# Patient Record
Sex: Female | Born: 1975 | Race: White | Hispanic: No | Marital: Single | Smoking: Current every day smoker
Health system: Southern US, Community
[De-identification: ages and names within clinical notes are randomized; demographics above are authoritative.]

## PROBLEM LIST (undated history)

## (undated) DIAGNOSIS — E119 Type 2 diabetes mellitus without complications: Secondary | ICD-10-CM

## (undated) DIAGNOSIS — I509 Heart failure, unspecified: Secondary | ICD-10-CM

## (undated) DIAGNOSIS — I10 Essential (primary) hypertension: Secondary | ICD-10-CM

---

## 2007-07-09 ENCOUNTER — Emergency Department (HOSPITAL_COMMUNITY): Admission: EM | Admit: 2007-07-09 | Discharge: 2007-07-09 | Payer: Self-pay | Admitting: Emergency Medicine

## 2007-07-12 ENCOUNTER — Emergency Department (HOSPITAL_COMMUNITY): Admission: EM | Admit: 2007-07-12 | Discharge: 2007-07-12 | Payer: Self-pay | Admitting: Emergency Medicine

## 2007-12-09 ENCOUNTER — Inpatient Hospital Stay (HOSPITAL_COMMUNITY): Admission: EM | Admit: 2007-12-09 | Discharge: 2007-12-15 | Payer: Self-pay | Admitting: Family Medicine

## 2009-10-10 IMAGING — CT CT ABDOMEN W/ CM
2 of 5 series · 17 of 46 positions shown, 19 images · IV contrast (APPLIED)
Comparison: None available

CT ABDOMEN

CLINICAL DATA: Left-sided pain, fever, vomiting

CT ABDOMEN AND PELVIS WITH CONTRAST
TECHNIQUE: Multidetector CT imaging of the abdomen and pelvis was
performed using the standard protocol following bolus
administration of intravenous contrast.
Contrast: 100 ml Tmnipaque-LWW IV

[Series 2: abd/pelv with 5.0 b31f st · axial · 0.76mm/px · z∈[-457,-67]mm · 14 of 88 slices shown, 16 images]
[im 5/88  soft-tissue]
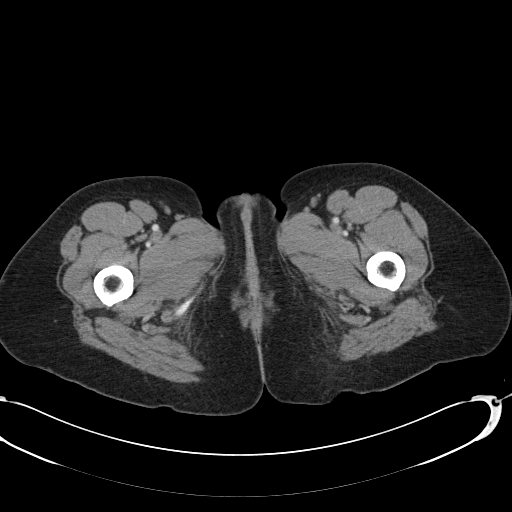
[im 5/88  bone]
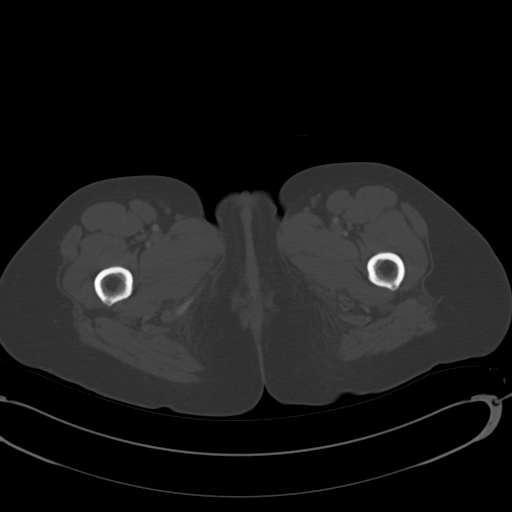
[im 10/88  soft-tissue]
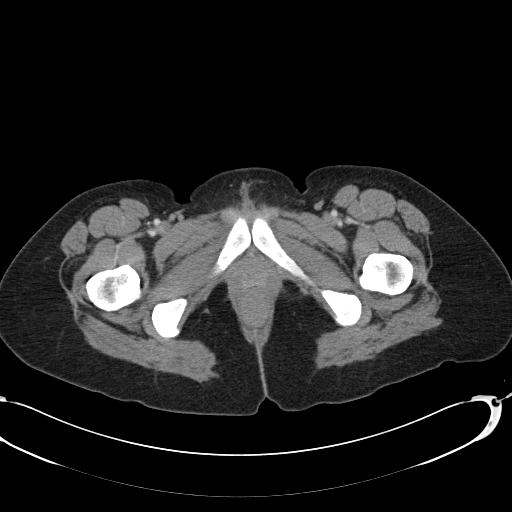
[im 20/88  soft-tissue]
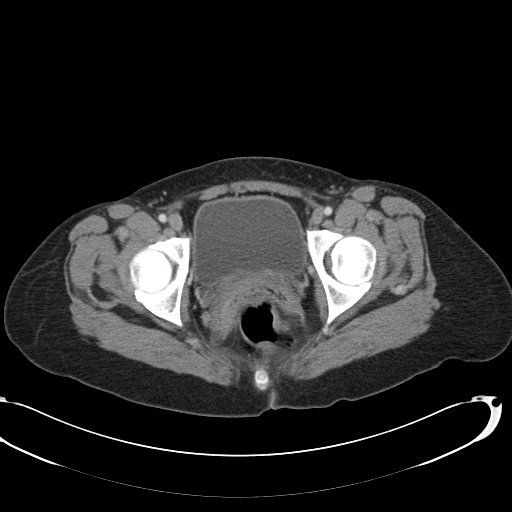
[im 25/88  soft-tissue]
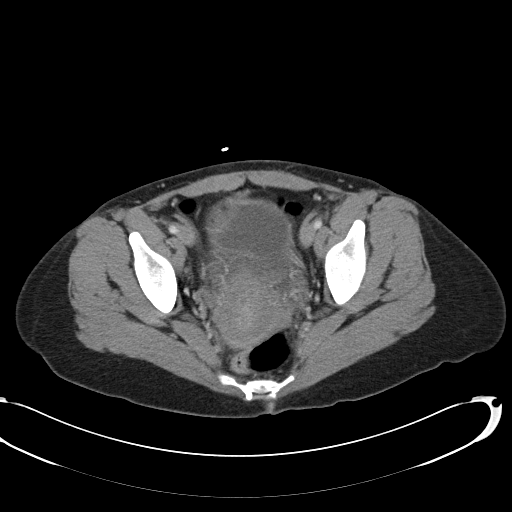
[im 30/88  soft-tissue]
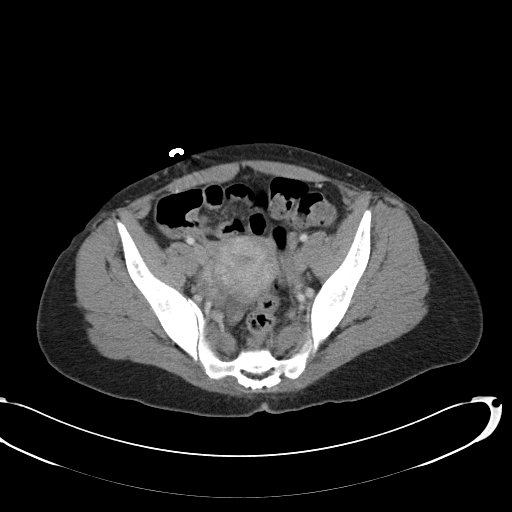
[im 34/88  soft-tissue]
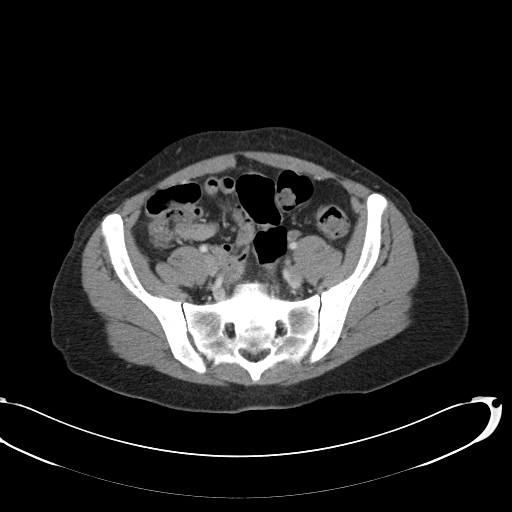
[im 39/88  soft-tissue]
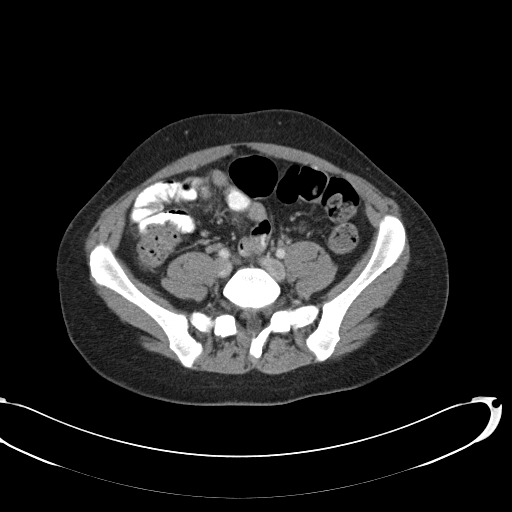
[im 49/88  soft-tissue]
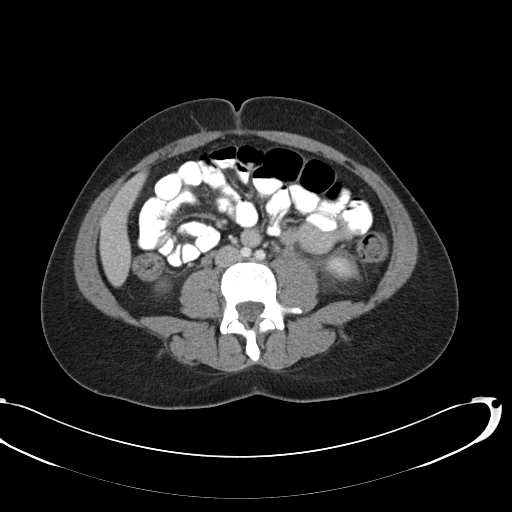
[im 54/88  soft-tissue]
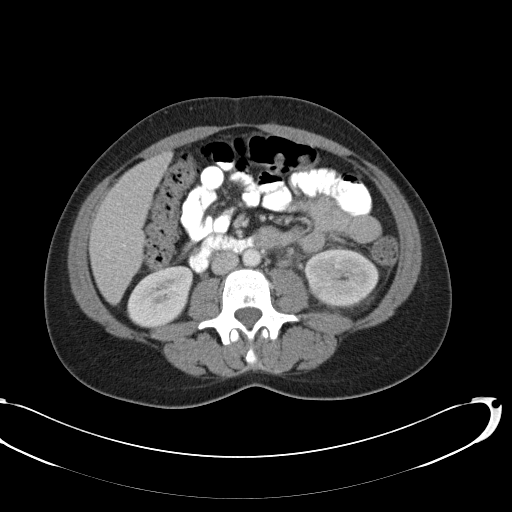
[im 54/88  bone]
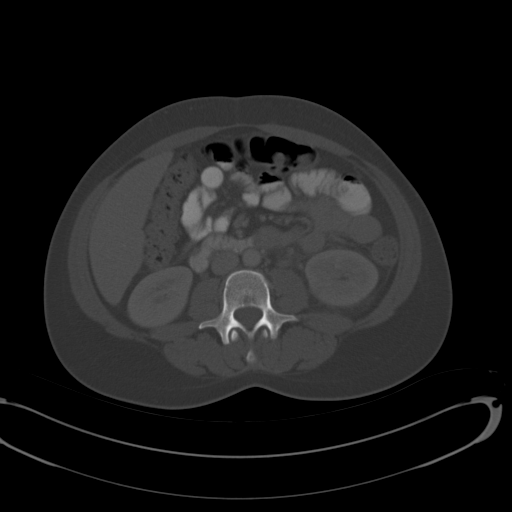
[im 59/88  soft-tissue]
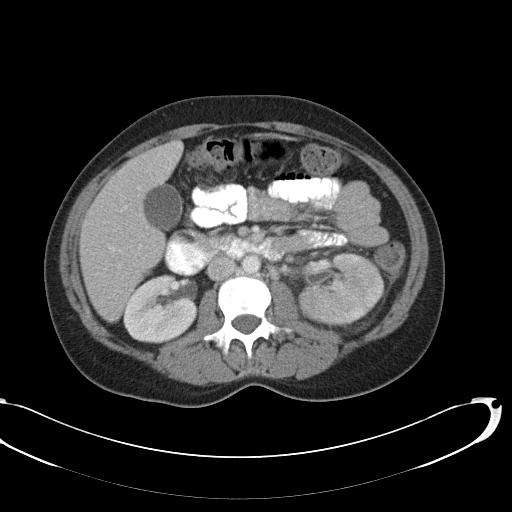
[im 63/88  soft-tissue]
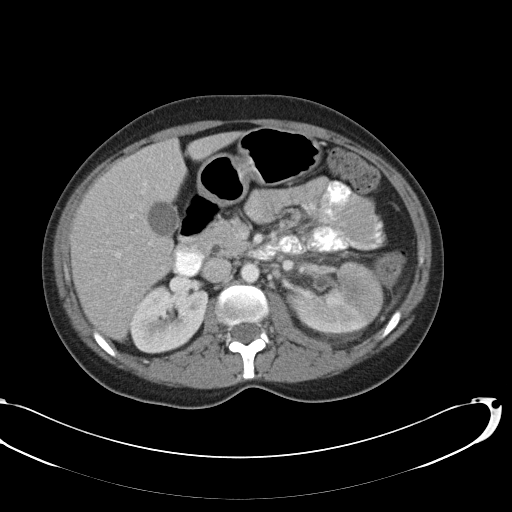
[im 68/88  soft-tissue]
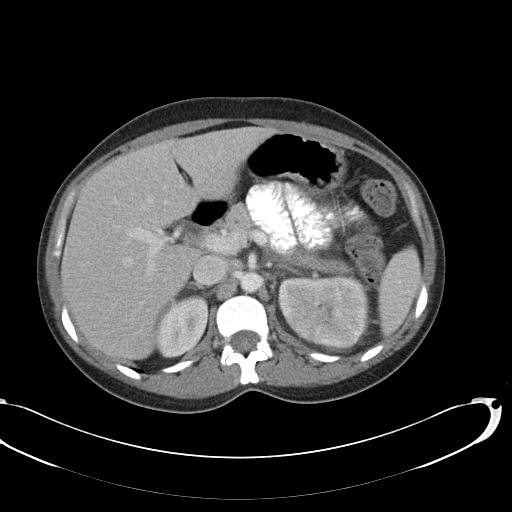
[im 78/88  soft-tissue]
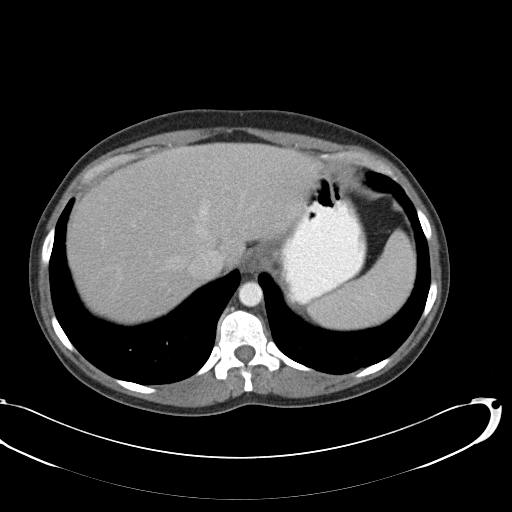
[im 83/88  soft-tissue]
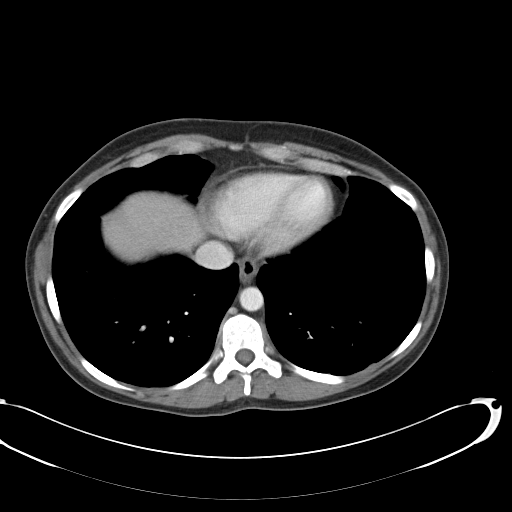

[Series 4: abd/pelv with 2.0 spo cor st · coronal · 0.86mm/px · 3 of 111 slices shown]
[im 37/111  soft-tissue]
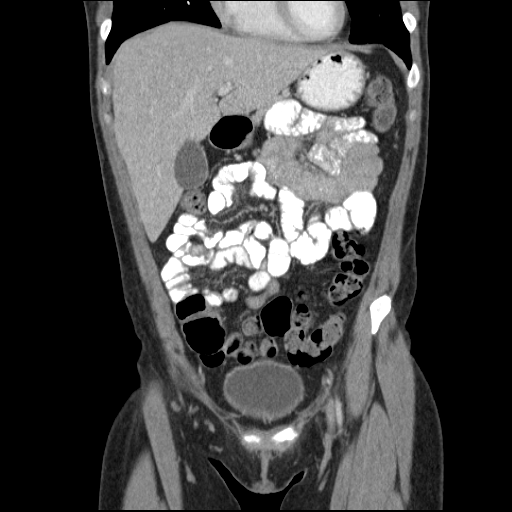
[im 49/111  soft-tissue]
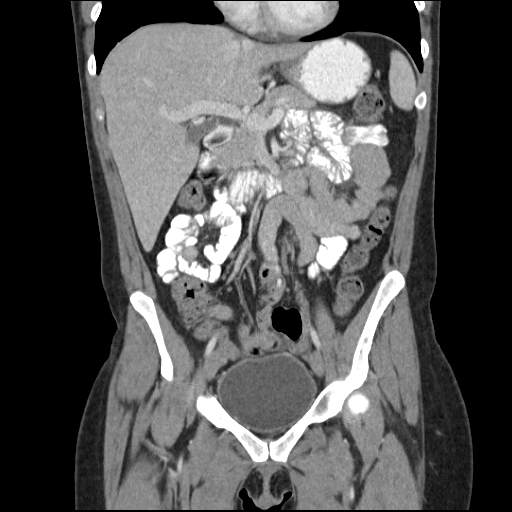
[im 62/111  soft-tissue]
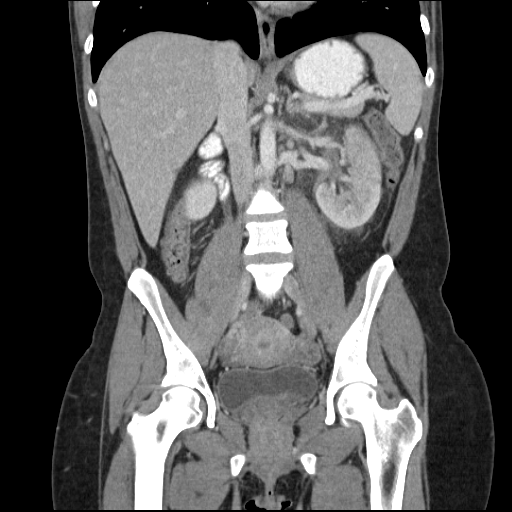

[17 of 46 positions shown; findings below may reference images not displayed]

FINDINGS: Visualized lung bases clear.  Unremarkable liver,
gallbladder, spleen, adrenal glands, right kidney, pancreas.  Left
kidney is somewhat enlarged with decreased   patchy areas of
enhancement on the initial scan with there is are patchy areas of
delayed nephrogram on the delayed images.  There is some urothelial
enhancement centrally in the renal collecting system.  No calculus
or hydronephrosis.  Inflammatory/edematous changes around the left
kidney.  A circumaortic left renal vein is incidentally noted.
Portal vein patent.  Small bowel nondilated.  No free air.  No
ascites.
IMPRESSION: 1.  Probable left pyelonephritis without evidence of calculus or
obstruction.

CT PELVIS
FINDINGS: Appendix not discretely identified.  The colon is
nondilated, unremarkable.  Uterus and adnexal regions unremarkable.
The urinary bladder is physiologically distended.  No free fluid.
Distal ureters decompressed without calculus.
IMPRESSION: 1.  No acute pelvic process

## 2010-10-15 ENCOUNTER — Emergency Department (HOSPITAL_BASED_OUTPATIENT_CLINIC_OR_DEPARTMENT_OTHER)
Admission: EM | Admit: 2010-10-15 | Discharge: 2010-10-15 | Disposition: A | Discharge status: Other Acute Inpatient Hospital | Payer: Self-pay | Attending: Emergency Medicine | Admitting: Emergency Medicine

## 2010-10-15 ENCOUNTER — Inpatient Hospital Stay (HOSPITAL_COMMUNITY)
Admission: RE | Admit: 2010-10-15 | Discharge: 2010-10-18 | DRG: 690 | Disposition: A | Discharge status: Home / Self Care | Payer: Self-pay | Source: Ambulatory Visit | Attending: Internal Medicine | Admitting: Internal Medicine

## 2010-10-15 DIAGNOSIS — A498 Other bacterial infections of unspecified site: Secondary | ICD-10-CM | POA: Diagnosis present

## 2010-10-15 DIAGNOSIS — R1031 Right lower quadrant pain: Secondary | ICD-10-CM | POA: Insufficient documentation

## 2010-10-15 DIAGNOSIS — R1110 Vomiting, unspecified: Secondary | ICD-10-CM | POA: Insufficient documentation

## 2010-10-15 DIAGNOSIS — F1020 Alcohol dependence, uncomplicated: Secondary | ICD-10-CM | POA: Diagnosis present

## 2010-10-15 DIAGNOSIS — N12 Tubulo-interstitial nephritis, not specified as acute or chronic: Principal | ICD-10-CM | POA: Diagnosis present

## 2010-10-15 DIAGNOSIS — R7309 Other abnormal glucose: Secondary | ICD-10-CM | POA: Diagnosis present

## 2010-10-15 DIAGNOSIS — R109 Unspecified abdominal pain: Secondary | ICD-10-CM | POA: Insufficient documentation

## 2010-10-15 DIAGNOSIS — F17200 Nicotine dependence, unspecified, uncomplicated: Secondary | ICD-10-CM | POA: Diagnosis present

## 2010-10-15 DIAGNOSIS — D72829 Elevated white blood cell count, unspecified: Secondary | ICD-10-CM | POA: Diagnosis present

## 2010-10-15 DIAGNOSIS — E860 Dehydration: Secondary | ICD-10-CM | POA: Diagnosis present

## 2010-10-15 LAB — DIFFERENTIAL
Basophils Absolute: 0 10*3/uL (ref 0.0–0.1)
Lymphocytes Relative: 10 % — ABNORMAL LOW (ref 12–46)
Monocytes Absolute: 2.1 10*3/uL — ABNORMAL HIGH (ref 0.1–1.0)
Monocytes Relative: 11 % (ref 3–12)
Neutrophils Relative %: 79 % — ABNORMAL HIGH (ref 43–77)

## 2010-10-15 LAB — PREGNANCY, URINE: Preg Test, Ur: NEGATIVE

## 2010-10-15 LAB — COMPREHENSIVE METABOLIC PANEL
ALT: 15 U/L (ref 0–35)
CO2: 24 mEq/L (ref 19–32)
Chloride: 102 mEq/L (ref 96–112)
Creatinine, Ser: 1 mg/dL (ref 0.4–1.2)
GFR calc Af Amer: >60 mL/min (ref >60)
Glucose, Bld: 87 mg/dL (ref 70–99)
Potassium: 4.2 mEq/L (ref 3.5–5.1)
Sodium: 143 mEq/L (ref 135–145)
Total Protein: 8.4 g/dL — ABNORMAL HIGH (ref 6.0–8.3)

## 2010-10-15 LAB — CBC
MCV: 93.3 fL (ref 78.0–100.0)
Platelets: 329 10*3/uL (ref 150–400)

## 2010-10-15 LAB — URINE MICROSCOPIC-ADD ON

## 2010-10-15 LAB — URINALYSIS, ROUTINE W REFLEX MICROSCOPIC
Bilirubin Urine: NEGATIVE
Nitrite: POSITIVE — AB
Protein, ur: 100 mg/dL — AB
Urobilinogen, UA: 0.2 mg/dL (ref 0.0–1.0)
pH: 7 (ref 5.0–8.0)

## 2010-10-16 LAB — COMPREHENSIVE METABOLIC PANEL
AST: 46 U/L — ABNORMAL HIGH (ref 0–37)
Alkaline Phosphatase: 74 U/L (ref 39–117)
Calcium: 8.1 mg/dL — ABNORMAL LOW (ref 8.4–10.5)
Creatinine, Ser: 0.93 mg/dL (ref 0.4–1.2)
GFR calc Af Amer: >60 mL/min (ref >60)
Glucose, Bld: 199 mg/dL — ABNORMAL HIGH (ref 70–99)
Total Bilirubin: 1 mg/dL (ref 0.3–1.2)

## 2010-10-16 LAB — CBC
MCHC: 32.3 g/dL (ref 30.0–36.0)
Platelets: 251 10*3/uL (ref 150–400)
RDW: 12.6 % (ref 11.5–15.5)

## 2010-10-16 LAB — DIFFERENTIAL
Basophils Absolute: 0 10*3/uL (ref 0.0–0.1)
Eosinophils Relative: 0 % (ref 0–5)
Lymphocytes Relative: 5 % — ABNORMAL LOW (ref 12–46)
Monocytes Absolute: 1.1 10*3/uL — ABNORMAL HIGH (ref 0.1–1.0)
Neutro Abs: 11.3 10*3/uL — ABNORMAL HIGH (ref 1.7–7.7)
Neutrophils Relative %: 86 % — ABNORMAL HIGH (ref 43–77)

## 2010-10-17 LAB — URINE CULTURE
Colony Count: >=100000
Culture  Setup Time: 201202262107

## 2010-10-17 LAB — BASIC METABOLIC PANEL
BUN: 2 mg/dL — ABNORMAL LOW (ref 6–23)
Chloride: 108 mEq/L (ref 96–112)
GFR calc non Af Amer: >60 mL/min (ref >60)

## 2010-10-17 LAB — CBC: MCH: 31.4 pg (ref 26.0–34.0)

## 2010-10-18 NOTE — Discharge Summary (Signed)
NAMEPATRICK, SOHM             ACCOUNT NO.:  [id number]  MEDICAL RECORD NO.:  [id number]           PATIENT TYPE:  I  LOCATION:  1321                         FACILITY:  [facility]  PHYSICIAN:  Hillery Aldo, M.D.   DATE OF BIRTH:  August 23, 1975  DATE OF ADMISSION:  10/15/2010 DATE OF DISCHARGE:  10/18/2010                              DISCHARGE SUMMARY   PRIMARY CARE PHYSICIAN:  Maurice March, M.D. at [facility]  DISCHARGE DIAGNOSES: 1. Escherichia coli pyelonephritis. 2. Alcoholism. 3. Tobacco abuse. 4. Hyperglycemia.  DISCHARGE MEDICATIONS: 1. Tylenol 650 mg p.o. q.4 h. p.r.n. fever. 2. Bactrim DS 1 tablet p.o. b.i.d. x10 days. 3. Hydrocodone 5/325, 1-2 tablets p.o. q.4 h. p.r.n. moderate to     severe pain. 4. Multivitamin 1 capsule p.o. daily.  CONSULTATIONS:  None.  BRIEF ADMISSION HISTORY OF PRESENT ILLNESS:  The patient is a 35 year old female with past medical history of alcohol dependency, who presentsto the hospital with dysuria for 3 days.  She also complained of right- sided flank pain.  She was evaluated in the emergency department and subsequently referred to the Hospitalist Service for inpatient treatment.  For full details, please see the dictated report done by Dr. Shawnie Dapper.  PROCEDURES/DIAGNOSTIC STUDIES:  None.  DISCHARGE LABORATORY VALUES:  Urine cultures grew greater than 100,000 colonies/mL of Escherichia coli, Septra sensitive.  Sodium was 137, potassium 3.9, chloride 108, bicarb 26, BUN 2, creatinine 0.84, glucose 151, calcium 8.3.  White blood cell count was 9.7, hemoglobin 12, hematocrit 38, platelets 217.  Blood cultures were negative to date.Marland Kitchen  HOSPITAL COURSE BY PROBLEM: 1. E coli pyelonephritis, the patient was placed on Rocephin     empirically.  Culture and sensitivity data have returned and the     organism is sensitive to Septra which she will be discharged on     additional 10 days of therapy.  Symptoms are resolving. 2. Alcohol  dependency, the patient was detoxed with Ativan per the     [facility] protocol.  She was supplemented with thiamine and folic acid     while in the hospital. 3. Tobacco abuse, the patient was counseled.  She was provided with     nicotine patch while in the hospital.  Encouraged to continue     nicotine patches to help with cessation post discharge. 4. Hyperglycemia.  The patient does not have a known diagnosis of     diabetes.  At this point, the patient has been counseled to     decrease sugar and concentrated sweets and to follow up with Dr.     Audria Nine for formal testing.  This should be done once the acute     illness of pyelonephritis has resolved.  DISPOSITION:  The patient is medically stable and will be discharged home.  Time spent coordinating care for discharge and discharge instructions including face-to-face time equaled 20 minutes.     Hillery Aldo, M.D.     CR/MEDQ  D:  10/18/2010  T:  10/18/2010  Job:  [id number]  cc:   Maurice March, M.D. Fax: [phone]  Electronically Signed by Hillery Aldo M.D. on 10/18/2010  04:19:17 PM

## 2010-10-21 LAB — CULTURE, BLOOD (ROUTINE X 2)
Culture  Setup Time: 201202262354
Culture  Setup Time: 201202262354
Culture: NO GROWTH
Culture: NO GROWTH

## 2011-01-02 NOTE — H&P (Signed)
Nancy [name], Nancy [name]             ACCOUNT NO.:  [id number]   MEDICAL RECORD NO.:  [id number]          PATIENT TYPE:  INP   LOCATION:  6729                         FACILITY:  [facility]   PHYSICIAN:  Della Goo, M.D. DATE OF BIRTH:  [birth year]   DATE OF ADMISSION:  12/09/2007  DATE OF DISCHARGE:                              HISTORY & PHYSICAL   PRIMARY CARE PHYSICIAN:  Georgianne Fick, M.D.   CHIEF COMPLAINT:  Fever.   HISTORY OF THE PRESENT ILLNESS:  This is a 35 year old female who  presented to the emergency department with complaints of fevers, chills  and shakes for 3 days.  She reports that on Sunday she had a temperature  of 104 and she took ibuprofen.  She reports also having dysuria for 1  week and also having left-sided flank pain.  She reports beginning to  have nausea and vomiting over the past 24 hours, and loose stools times  one.   PAST MEDICAL HISTORY:  The past medical history significant for anxiety.   PAST SURGICAL HISTORY:  History of a C-section times one.   MEDICATIONS:  The patient's medications at this time include ibuprofen  400 mg as needed and Darvocet N 100 as needed.   ALLERGIES:  No known drug allergies.   SOCIAL HISTORY:  The patient is a smoker and smokes one pack per day.  She is a drinker and reports drinking alcohol, several drinks, but  reports that she has not drank for the past 3 days.   FAMILY HISTORY:  The family history is noncontributory.   REVIEW OF SYSTEMS:  On the review of systems the pertinents are as  mentioned above.  The patient denies any shortness of breath, chest  pains, headache, syncope, or dizziness.   PHYSICAL EXAMINATION:  GENERAL APPEARANCE:  Physical examination  findings reveal this is a 35 year old well-nourished and well-developed  female in discomfort, but in no acute distress.  VITAL SIGNS:  Temperature 101.2, blood pressure 118/78, heart rate 88,  respirations 18, and O2 sat 98-99%.  HEENT:   Normocephalic and atraumatic.  Pupils are equal, round and react  to light.  Extraocular muscles are intact.  Funduscopic is benign.  Oropharynx is clear.  NECK:  The neck is supple with full range of motion.  No thyromegaly,  adenopathy or jugular venous distention.  HEART:  Cardiovascular exam shows regular rate and rhythm.  No murmurs,  gallops or rubs.  LUNGS:  The lungs are clear to auscultation bilaterally.  ABDOMEN:  The abdomen has positive bowel sounds, is soft, nontender and  no distended.  EXTREMITIES:  The extremities are without cyanosis, clubbing or edema.  BACK EXAMINATION:  The back examination shows positive left-sided flank  pain.  NEUROLOGIC:  The neurological examination is nonfocal.   LABORATORY STUDIES:  White blood cell count 13.4, hemoglobin 11.7,  hematocrit 33.8, MCV 95.5, and platelets 171,000 with neutrophils 89%.  Chemistry with a sodium of 136, potassium 3.2, chloride 105 carbon  dioxide 25, BUN 10, creatinine 1.03, and glucose 100.  Alcohol level was  less than 5.  Amylase 28 and lipase 14.  Urine drug screen was positive  for opiates.  Urinalysis showed large leukocyte esterase,  positive  nitrites and moderate urine hemoglobin.  Chest x-ray revealed no active  or acute disease.  CT scan of the abdomen and pelvis revealed left  pyelonephritis without calculus or obstruction; and, no other remarkable  findings.   ASSESSMENT:  The patient is a 35 year old female being admitted with:  1. Pyelonephritis.  2. Sepsis.  3. Hypokalemia.  4. Mild anemia  5. Tobacco abuse.   PLAN:  The patient will be admitted and will be placed on IV antibiotic  therapy of ciprofloxacin along with Rocephin therapy.  The patient will  also be placed on IV fluids for fluid rehydration therapy and  maintenance therapy.  Antiemetics have been ordered along with pain  control therapy as needed.  The patient will have repletion of her  potassium and her electrolytes will be  monitored.  A nicotine patch will  also be ordered for this patient.  She has been counseled on smoking and  alcohol usage.  DVT and GI prophylaxes have also been ordered.      Della Goo, M.D.  Electronically Signed     HJ/MEDQ  D:  12/10/2007  T:  12/10/2007  Job:  [id number]   cc:   Georgianne Fick, M.D.

## 2011-01-05 NOTE — Discharge Summary (Signed)
[name], [name]             ACCOUNT NO.:  [id number]   MEDICAL RECORD NO.:  [id number]          PATIENT TYPE:  INP   LOCATION:  6729                         FACILITY:  [facility]   PHYSICIAN:  Lonia Blood, M.D.      DATE OF BIRTH:  [birth year]   DATE OF ADMISSION:  12/09/2007  DATE OF DISCHARGE:  12/15/2007                               DISCHARGE SUMMARY   PRIMARY CARE PHYSICIAN:  [facility]   DISCHARGE DIAGNOSES:  1. Escherichia coli sepsis.  2. Delirium tremens.  3. Tobacco abuse.   DISCHARGE MEDICATIONS:  1. Bactrim Double Strength 1 tablet twice a day for 8 days.  2. Vicodin 5/500 one tablet every 6 hours as needed.  3. Thiamine 100 mg daily.  4. Folate 1 mg daily.   DISPOSITION:  The patient was to follow up at [facility] on completion  of 2 weeks total of antibiotics.  She is to complete with Septra.  Her  E. coli was sensitive to Septra.   PROCEDURES PERFORMED:  None.   CONSULTATIONS:  None.   BRIEF HISTORY AND PHYSICAL:  Please refer to dictated history and  physical on admission by Dr. Della Goo.  Of note, the patient  also follows with Dr. Georgianne Fick.  The patient is a 35 year old  female that came in with fever and chills as well as shakes for about 3  days.  She reported a temperature of 104 at the time of admission.  In  the ER, however, she had a fever of 101.2.  Otherwise, no other  findings.  Her urinalysis was consistent with UTI, and she has some  costovertebral angle tenderness.  She was subsequently admitted with a  diagnosis of acute pyelonephritis.   HOSPITAL COURSE:  1. E. coli sepsis.  The patient was started on IV antibiotics with      ciprofloxacin.  Blood cultures later came back with positive E.      coli consistent with also her urine.  She responded to the      antibiotics for a while.  At the time of discharge, based on the      sensitivity of her E. coli, she was discharged on Septra to      complete her medications at  home.  2. DT.  The patient reported taking alcohol several drinks a day.  She      was a little bit jittery by the second day and      was presumed to be having delirium tremens.  She was subsequently      started on thiamine folate.  I gave her some Ativan, which seemed      to have calmed her down.  At the time of discharge, she was very      much stable.  Otherwise, no major issues with the patient at the      time of discharge.      Lonia Blood, M.D.  Electronically Signed     LG/MEDQ  D:  02/04/2008  T:  02/04/2008  Job:  [id number]

## 2011-05-15 LAB — CBC
HCT: 31.9 — ABNORMAL LOW
HCT: 32.4 — ABNORMAL LOW
Hemoglobin: 11.6 — ABNORMAL LOW
MCHC: 33.8
MCHC: 34.8
MCHC: 35.8
MCV: 96
MCV: 96.3
Platelets: 220
RBC: 3.38 — ABNORMAL LOW
RBC: 3.54 — ABNORMAL LOW
RDW: 12.8
RDW: 13.6
WBC: 11.2 — ABNORMAL HIGH
WBC: 15.7 — ABNORMAL HIGH

## 2011-05-15 LAB — POCT URINALYSIS DIP (DEVICE)
Glucose, UA: NEGATIVE
Nitrite: POSITIVE — AB
Operator id: 208841
Protein, ur: >=300 — AB
Specific Gravity, Urine: 1.025

## 2011-05-15 LAB — URINALYSIS, ROUTINE W REFLEX MICROSCOPIC
Glucose, UA: NEGATIVE
Hgb urine dipstick: NEGATIVE
Ketones, ur: NEGATIVE
Protein, ur: NEGATIVE
Urobilinogen, UA: 4 — ABNORMAL HIGH

## 2011-05-15 LAB — URINE MICROSCOPIC-ADD ON

## 2011-05-15 LAB — COMPREHENSIVE METABOLIC PANEL
Albumin: 2.4 — ABNORMAL LOW
BUN: 10
Calcium: 7.7 — ABNORMAL LOW
Creatinine, Ser: 1.03
Glucose, Bld: 100 — ABNORMAL HIGH
Potassium: 3.2 — ABNORMAL LOW
Total Protein: 5.6 — ABNORMAL LOW

## 2011-05-15 LAB — BASIC METABOLIC PANEL
BUN: 2 — ABNORMAL LOW
CO2: 23
CO2: 24
CO2: 28
Calcium: 8.7
Chloride: 109
Chloride: 115 — ABNORMAL HIGH
Creatinine, Ser: 0.86
GFR calc Af Amer: >60
GFR calc Af Amer: >60
Glucose, Bld: 124 — ABNORMAL HIGH
Glucose, Bld: 94
Potassium: 4
Potassium: 4

## 2011-05-15 LAB — URINE CULTURE

## 2011-05-15 LAB — CULTURE, BLOOD (ROUTINE X 2)

## 2011-05-15 LAB — DIFFERENTIAL
Basophils Relative: 0
Basophils Relative: 0
Eosinophils Absolute: 0.1
Eosinophils Absolute: 0.1
Eosinophils Relative: 1
Eosinophils Relative: 1
Lymphocytes Relative: 3 — ABNORMAL LOW
Lymphs Abs: 0.4 — ABNORMAL LOW
Lymphs Abs: 1.2
Lymphs Abs: 1.7
Monocytes Absolute: 1
Monocytes Absolute: 1.8 — ABNORMAL HIGH
Monocytes Relative: 12
Monocytes Relative: 8
Neutro Abs: 11.9 — ABNORMAL HIGH
Neutrophils Relative %: 89 — ABNORMAL HIGH

## 2011-05-15 LAB — RAPID URINE DRUG SCREEN, HOSP PERFORMED
Amphetamines: NOT DETECTED
Barbiturates: NOT DETECTED
Benzodiazepines: NOT DETECTED

## 2011-05-15 LAB — ETHANOL: Alcohol, Ethyl (B): <5

## 2011-05-15 LAB — PHOSPHORUS: Phosphorus: 2.1 — ABNORMAL LOW

## 2011-05-29 LAB — POCT PREGNANCY, URINE
Operator id: 285491
Preg Test, Ur: NEGATIVE

## 2011-12-23 ENCOUNTER — Emergency Department (HOSPITAL_BASED_OUTPATIENT_CLINIC_OR_DEPARTMENT_OTHER)
Admission: EM | Admit: 2011-12-23 | Discharge: 2011-12-23 | Disposition: A | Discharge status: Home / Self Care | Payer: No Typology Code available for payment source | Attending: Emergency Medicine | Admitting: Emergency Medicine

## 2011-12-23 ENCOUNTER — Encounter (HOSPITAL_BASED_OUTPATIENT_CLINIC_OR_DEPARTMENT_OTHER): Payer: Self-pay

## 2011-12-23 DIAGNOSIS — R51 Headache: Secondary | ICD-10-CM | POA: Insufficient documentation

## 2011-12-23 DIAGNOSIS — S161XXA Strain of muscle, fascia and tendon at neck level, initial encounter: Secondary | ICD-10-CM

## 2011-12-23 DIAGNOSIS — M545 Low back pain, unspecified: Secondary | ICD-10-CM | POA: Insufficient documentation

## 2011-12-23 DIAGNOSIS — M25569 Pain in unspecified knee: Secondary | ICD-10-CM | POA: Insufficient documentation

## 2011-12-23 DIAGNOSIS — S335XXA Sprain of ligaments of lumbar spine, initial encounter: Secondary | ICD-10-CM | POA: Insufficient documentation

## 2011-12-23 DIAGNOSIS — F17200 Nicotine dependence, unspecified, uncomplicated: Secondary | ICD-10-CM | POA: Insufficient documentation

## 2011-12-23 DIAGNOSIS — S139XXA Sprain of joints and ligaments of unspecified parts of neck, initial encounter: Secondary | ICD-10-CM | POA: Insufficient documentation

## 2011-12-23 DIAGNOSIS — S8000XA Contusion of unspecified knee, initial encounter: Secondary | ICD-10-CM | POA: Insufficient documentation

## 2011-12-23 DIAGNOSIS — M542 Cervicalgia: Secondary | ICD-10-CM | POA: Insufficient documentation

## 2011-12-23 DIAGNOSIS — S8002XA Contusion of left knee, initial encounter: Secondary | ICD-10-CM

## 2011-12-23 DIAGNOSIS — S39012A Strain of muscle, fascia and tendon of lower back, initial encounter: Secondary | ICD-10-CM

## 2011-12-23 DIAGNOSIS — Y92410 Unspecified street and highway as the place of occurrence of the external cause: Secondary | ICD-10-CM | POA: Insufficient documentation

## 2011-12-23 MED ORDER — DIAZEPAM 5 MG PO TABS: 5.0000 mg | ORAL_TABLET | Freq: Two times a day (BID) | ORAL | Status: AC

## 2011-12-23 MED ORDER — HYDROCODONE-ACETAMINOPHEN 5-325 MG PO TABS: 1.0000 | ORAL_TABLET | ORAL | Status: AC | PRN

## 2011-12-23 NOTE — Discharge Instructions (Signed)
Take vicodin as needed for severe pain.  Take valium for aches/spasms in neck and low back.  Do not drive within four hours of taking either of these medications.  Continue your advil three times a day for the next 3-4 days.  Apply heat or ice to painful muscles and ice to left knee 2-3 times a day for 15-20 minutes.  Elevate left leg whenever possible.  Avoid activities that aggravate pain.   You may return to the ER if symptoms worsen or you have any other concerns.
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
## 2011-12-24 NOTE — ED Provider Notes (Signed)
Medical screening examination/treatment/procedure(s) were performed by non-physician practitioner and as supervising physician I was immediately available for consultation/collaboration.   Gwyneth Sprout, MD 12/24/11 2144

## 2013-12-27 ENCOUNTER — Emergency Department (HOSPITAL_BASED_OUTPATIENT_CLINIC_OR_DEPARTMENT_OTHER): Payer: Self-pay

## 2013-12-27 ENCOUNTER — Encounter (HOSPITAL_BASED_OUTPATIENT_CLINIC_OR_DEPARTMENT_OTHER): Payer: Self-pay | Admitting: Emergency Medicine

## 2013-12-27 ENCOUNTER — Emergency Department (HOSPITAL_BASED_OUTPATIENT_CLINIC_OR_DEPARTMENT_OTHER)
Admission: EM | Admit: 2013-12-27 | Discharge: 2013-12-27 | Disposition: A | Discharge status: Home / Self Care | Payer: Self-pay | Attending: Emergency Medicine | Admitting: Emergency Medicine

## 2013-12-27 DIAGNOSIS — F1010 Alcohol abuse, uncomplicated: Secondary | ICD-10-CM | POA: Insufficient documentation

## 2013-12-27 DIAGNOSIS — S5010XA Contusion of unspecified forearm, initial encounter: Secondary | ICD-10-CM | POA: Insufficient documentation

## 2013-12-27 DIAGNOSIS — S60221A Contusion of right hand, initial encounter: Secondary | ICD-10-CM

## 2013-12-27 DIAGNOSIS — W2209XA Striking against other stationary object, initial encounter: Secondary | ICD-10-CM | POA: Insufficient documentation

## 2013-12-27 DIAGNOSIS — Y929 Unspecified place or not applicable: Secondary | ICD-10-CM | POA: Insufficient documentation

## 2013-12-27 DIAGNOSIS — Y9389 Activity, other specified: Secondary | ICD-10-CM | POA: Insufficient documentation

## 2013-12-27 DIAGNOSIS — S60229A Contusion of unspecified hand, initial encounter: Secondary | ICD-10-CM | POA: Insufficient documentation

## 2013-12-27 DIAGNOSIS — F17200 Nicotine dependence, unspecified, uncomplicated: Secondary | ICD-10-CM | POA: Insufficient documentation

## 2013-12-27 DIAGNOSIS — S5011XA Contusion of right forearm, initial encounter: Secondary | ICD-10-CM

## 2013-12-27 DIAGNOSIS — F10929 Alcohol use, unspecified with intoxication, unspecified: Secondary | ICD-10-CM
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
## 2014-06-25 ENCOUNTER — Emergency Department (HOSPITAL_COMMUNITY)
Admission: EM | Admit: 2014-06-25 | Discharge: 2014-06-25 | Disposition: A | Discharge status: Home / Self Care | Payer: No Typology Code available for payment source | Attending: Emergency Medicine | Admitting: Emergency Medicine

## 2014-06-25 ENCOUNTER — Encounter (HOSPITAL_COMMUNITY): Payer: Self-pay | Admitting: *Deleted

## 2014-06-25 DIAGNOSIS — F1110 Opioid abuse, uncomplicated: Secondary | ICD-10-CM | POA: Insufficient documentation

## 2014-06-25 DIAGNOSIS — F1410 Cocaine abuse, uncomplicated: Secondary | ICD-10-CM | POA: Insufficient documentation

## 2014-06-25 DIAGNOSIS — F329 Major depressive disorder, single episode, unspecified: Secondary | ICD-10-CM | POA: Insufficient documentation

## 2014-06-25 DIAGNOSIS — F1910 Other psychoactive substance abuse, uncomplicated: Secondary | ICD-10-CM

## 2014-06-25 DIAGNOSIS — F419 Anxiety disorder, unspecified: Secondary | ICD-10-CM | POA: Insufficient documentation

## 2014-06-25 DIAGNOSIS — Z720 Tobacco use: Secondary | ICD-10-CM | POA: Insufficient documentation

## 2014-06-25 LAB — BASIC METABOLIC PANEL
Anion gap: 11 (ref 5–15)
BUN: 8 mg/dL (ref 6–23)
CO2: 25 meq/L (ref 19–32)
CREATININE: 0.75 mg/dL (ref 0.50–1.10)
Calcium: 8.9 mg/dL (ref 8.4–10.5)
Chloride: 100 mEq/L (ref 96–112)
GFR calc Af Amer: >90 mL/min (ref >90)
GFR calc non Af Amer: >90 mL/min (ref >90)
Glucose, Bld: 107 mg/dL — ABNORMAL HIGH (ref 70–99)
Potassium: 3.6 mEq/L — ABNORMAL LOW (ref 3.7–5.3)
SODIUM: 136 meq/L — AB (ref 137–147)

## 2014-06-25 LAB — CBC WITH DIFFERENTIAL/PLATELET
Basophils Absolute: 0 10*3/uL (ref 0.0–0.1)
Basophils Relative: 1 % (ref 0–1)
EOS PCT: 1 % (ref 0–5)
Eosinophils Absolute: 0.1 10*3/uL (ref 0.0–0.7)
HEMATOCRIT: 38.4 % (ref 36.0–46.0)
Hemoglobin: 12.9 g/dL (ref 12.0–15.0)
LYMPHS ABS: 2 10*3/uL (ref 0.7–4.0)
LYMPHS PCT: 33 % (ref 12–46)
MCH: 31.5 pg (ref 26.0–34.0)
MCHC: 33.6 g/dL (ref 30.0–36.0)
MCV: 93.9 fL (ref 78.0–100.0)
MONO ABS: 0.5 10*3/uL (ref 0.1–1.0)
MONOS PCT: 9 % (ref 3–12)
Neutro Abs: 3.4 10*3/uL (ref 1.7–7.7)
Neutrophils Relative %: 56 % (ref 43–77)
Platelets: 358 10*3/uL (ref 150–400)
RBC: 4.09 MIL/uL (ref 3.87–5.11)
RDW: 13.1 % (ref 11.5–15.5)
WBC: 6 10*3/uL (ref 4.0–10.5)

## 2014-06-25 LAB — ACETAMINOPHEN LEVEL: Acetaminophen (Tylenol), Serum: <15 ug/mL (ref 10–30)

## 2014-06-25 LAB — RAPID URINE DRUG SCREEN, HOSP PERFORMED
Amphetamines: NOT DETECTED
Barbiturates: NOT DETECTED
Benzodiazepines: NOT DETECTED
COCAINE: POSITIVE — AB
Opiates: NOT DETECTED
Tetrahydrocannabinol: NOT DETECTED

## 2014-06-25 LAB — SALICYLATE LEVEL

## 2014-06-25 LAB — ETHANOL: Alcohol, Ethyl (B): 240 mg/dL — ABNORMAL HIGH (ref 0–11)

## 2014-06-25 MED ORDER — IBUPROFEN 200 MG PO TABS
600.0000 mg | ORAL_TABLET | Freq: Three times a day (TID) | ORAL | Status: DC | PRN
Start: 2014-06-25 — End: 2014-06-25

## 2014-06-25 MED ORDER — LORAZEPAM 1 MG PO TABS
1.0000 mg | ORAL_TABLET | Freq: Three times a day (TID) | ORAL | Status: DC | PRN
Start: 2014-06-25 — End: 2014-06-25

## 2014-06-25 MED ORDER — NICOTINE 21 MG/24HR TD PT24: 21.0000 mg | MEDICATED_PATCH | Freq: Every day | TRANSDERMAL | Status: DC

## 2014-06-25 MED ORDER — CLONIDINE HCL 0.1 MG PO TABS: 0.1000 mg | ORAL_TABLET | Freq: Four times a day (QID) | ORAL | Status: DC | PRN

## 2014-06-25 MED ORDER — PROMETHAZINE HCL 25 MG PO TABS: 25.0000 mg | ORAL_TABLET | Freq: Four times a day (QID) | ORAL | Status: DC | PRN

## 2014-06-25 MED ORDER — ONDANSETRON HCL 4 MG PO TABS
4.0000 mg | ORAL_TABLET | Freq: Three times a day (TID) | ORAL | Status: DC | PRN
Start: 2014-06-25 — End: 2014-06-25

## 2014-06-25 NOTE — ED Notes (Signed)
Patient requesting to leave. Levander CampionSheri Upstill, PA-C made aware of same. Patient to sign out AMA when driver arrives to room.
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
## 2014-06-25 NOTE — Discharge Instructions (Signed)
°Emergency Department Resource Guide °1) Find a Doctor and Pay Out of Pocket °Although you won't have to find out who is covered by your insurance plan, it is a good idea to ask around and get recommendations. You will then need to call the office and see if the doctor you have chosen will accept you as a new patient and what types of options they offer for patients who are self-pay. Some doctors offer discounts or will set up payment plans for their patients who do not have insurance, but you will need to ask so you aren't surprised when you get to your appointment. ° °2) Contact Your Local Health Department °Not all health departments have doctors that can see patients for sick visits, but many do, so it is worth a call to see if yours does. If you don't know where your local health department is, you can check in your phone book. The CDC also has a tool to help you locate your state's health department, and many state websites also have listings of all of their local health departments. ° °3) Find a Walk-in Clinic °If your illness is not likely to be very severe or complicated, you may want to try a walk in clinic. These are popping up all over the country in pharmacies, drugstores, and shopping centers. They're usually staffed by nurse practitioners or physician assistants that have been trained to treat common illnesses and complaints. They're usually fairly quick and inexpensive. However, if you have serious medical issues or chronic medical problems, these are probably not your best option. ° °No Primary Care Doctor: °- Call [facility] at  832-8000 - they can help you locate a primary care doctor that  accepts your insurance, provides certain services, etc. °- Physician Referral Service- 1-800-533-3463 ° °Chronic Pain Problems: °Organization         Address  Phone   Notes  °[facility]  (336) 297-2271 Patients need to be referred by their primary care doctor.  ° °Medication  Assistance: °Organization         Address  Phone   Notes  °[place] [facility] [address] °[place], [place] [address] (336) 641-8030 --Must be a resident of [place] °-- Must have NO insurance coverage whatsoever (no Medicaid/ Medicare, etc.) °-- The pt. MUST have a primary care doctor that directs their care regularly and follows them in the community °  °[facility]  (866) 331-1348   °[employer]  (888) 892-1162   ° °Agencies that provide inexpensive medical care: °Organization         Address  Phone   Notes  °[facility]  (336) 832-8035   °[facility]    (336) 832-7272   °[facility] [address] °[place], [place] [address] (336) 832-4777   °[facility] [address], °[place] (336) 271-4999   °[facility]    (336) 373-0678   °[facility]    (336) 272-1050   °[facility] ° [address], [place] Phone:  (336) 832-4444, Fax:  (336) 832-4440 Hours of Operation:  9 am - 6 pm, M-F.  Also accepts Medicaid/Medicare and self-pay.  °[facility] ° [address], [place] Phone: (336) 832-3150, Fax: (336) 832-3151. Hours of Operation:  8:30 am - 5:30 pm, M-F.  Also accepts Medicaid and self-pay.  °[facility] [place] [address], [place] Phone: (336) 878-6027   °[facility] [address], [place], [place] (336)723-1848, Ext. 123 Mondays & Thursdays: 7-9 AM.  First 15 patients are seen on a first come, first serve basis. °  ° °Medicaid-accepting [place] Providers: ° °Organization         Address  Phone   Notes  °[facility] [address], [place] (336) 641-2100 Also accepts self-pay patients.  °[facility] [address], [place] ° (336) 856-9996   °[facility] [address], [place]  (336) 288-8857   °[facility] [address] [place] [address], [place] (336) 299-7000   °Veita Bland [address], [place]  ° (336) 373-1557 Only accepts [place] Access Medicaid patients after they have their name applied to their card.  ° °Self-Pay (no insurance) in [place]: ° °Organization         Address  Phone   Notes  °Sickle Cell Patients, [facility] [address], [place] (336) 832-1970   °[facility] [address], [place] (336) 832-4400   °[facility] ° [address],  (336) 992-4800   °[facility]/Dr. Osei-Bonsu ° [address] [place] [address], [place] or [address], [place] (336) 841-8500 Phone number for both [place] and [place] locations is the same.  °[facility] [address], [place] (336) 299-0000   °[facility] [address] [place] [address], [place] or [address] (336) 852-7530 °(336) 878-2260   °[facility] [address], [place] (336) 350-1642, phone; (336) 294-5005, fax Sees patients 1st and 3rd Saturday of every month.  Must not qualify for public or private insurance (i.e. Medicaid, Medicare, [place] Health Choice, Veterans' Benefits) • Household income should be no more than 200% of the poverty level •The clinic cannot treat you if you are pregnant or think you are pregnant • Sexually transmitted diseases are not treated at the clinic.  ° ° °Dental Care: °Organization         Address  Phone  Notes  °[place] [facility] [address],  (336) 641-6152 Accepts children up to age 21 who are enrolled in Medicaid or [place] Health Choice; pregnant women with a Medicaid card; and children who have applied for Medicaid or [place] Health Choice, but were declined, whose parents can pay a reduced fee at time of service.  °[place]  [facility] [place]  [address], [place] (336) 641-7733 Accepts children up to age 21 who are enrolled in Medicaid or [place] Health Choice; pregnant women with a Medicaid card; and children who have applied for Medicaid or [place] Health Choice, but were declined, whose parents can pay a reduced fee at time of service.  °[facility] ° [address],  (336) 641-4533 Patients are seen by appointment only. Walk-ins are not accepted. [facility] will see patients 18 years of age and older. °Monday - Tuesday (8am-5pm) °Most Wednesdays (8:30-5pm) °$30 per visit, cash only  °[facility] ° [address], [place] (336) 641-4533 Patients are seen by appointment only. Walk-ins are not accepted. [facility] will see patients 18 years of age and older. °One   Wednesday Evening (Monthly: Volunteer Based).  $30 per visit, cash only  °[facility]  (919) 537-3737 for adults; Children under age 4, call Graduate Pediatric Dentistry at ([phone]) [phone]. Children aged 4-14, please call (919) [phone] to request a pediatric application. ° Dental services are provided in all areas of dental care including fillings, crowns and bridges, complete and partial dentures, implants, gum treatment, root canals, and extractions. Preventive care is also provided. Treatment is provided to both adults and children. °Patients are selected via a lottery and there is often a waiting list. °  °[facility] [address], °[place] ° (336) 763-8833 [email or web address] °  °[facility] [address], [place], [place] (336)723-1848, Ext. 123 Second and Fourth Thursday of each month, opens at 6:30 AM; Clinic ends at 9 AM.  Patients are seen on a first-come first-served basis, and a limited number are seen during each clinic.  ° °[facility] ° [address], [place], [place] (336) 723-7904    Eligibility Requirements °You must have lived in [place], [place], or [place] counties for at least the last three months. °  You cannot be eligible for state or federal sponsored healthcare insurance, including Veterans Administration, Medicaid, or Medicare. °  You generally cannot be eligible for healthcare insurance through your employer.  °  How to apply: °Eligibility screenings are held every Tuesday and Wednesday afternoon from 1:00 pm until 4:00 pm. You do not need an appointment for the interview!  °[facility] [address], [place], [place] 336-631-2330   °[facility]  336-342-8273   °[facility]  336-703-3100   °[facility]  336-570-6415   ° °Behavioral Health Resources in the Community: °Intensive Outpatient Programs °Organization         Address  Phone  Notes  °[facility] [address], [place], [place] 336-878-6098   °[facility] [address], [place], [place] 336-832-9800   °[facility] [address], [place], [place] ° 336-882-2125   °[facility] [address],  °[place], [place] 1-800-853-5163 or 336-641-4981   °Substance Abuse Resources °Organization         Address  Phone  Notes  °[facility]  336-882-2125   °[facility]  336-784-9470   °[facility]  336-285-9073   °[facility]  336-845-3988   °Residential & Outpatient Substance Abuse Program  1-800-659-3381   °Psychological Services °Organization         Address  Phone  Notes  °[facility]  336- 832-9600   °[facility]  336- 378-7881   °[facility] [address], [place] 1-800-853-5163 or 336-641-4981   ° °Mobile Crisis Teams °Organization         Address  Phone  Notes  °[facility], Mobile Crisis Care Unit  1-877-626-1772   °Assertive °[facility] ° [address]  [place], [place] 336-834-9664   °Sharon DeEsch [address] ° [place]  336-554-5454   ° °Self-Help/Support Groups °Organization         Address  Phone             Notes  °[facility] - variety of support groups  336- 373-1402 Call for more information  °Narcotics Anonymous (NA), [facility] [address], °[place]   2 meetings at this location  ° °  Residential Treatment Programs °Organization         Address  Phone  Notes  °[facility] [address],    °[place]  1-866-801-8205   °[facility] ° [address], [place], [place] 704-293-8524   °[facility] [address], [place] 336-845-3988 Admissions: 8am-3pm M-F  °[facility] [address]    °[place], [place] 336-841-1104   °[facility] [address], [place], [place] 336-379-7146   °[facility] [address]  °[place], [place] 336-285-9073   °[facility] - Intensive Outpatient [address], [place], [place] 336-852-3033   °[facility] [address]  °[place], [place] 1-877-615-2722 or 336-784-9470   °[facility] [address] [place], [place] 336-227-7417 Accepts Medicaid  °[facility] [address]  °[place] 1-800-659-3381 Substance Abuse/Addiction Treatment  ° °[place] Behavioral Health Resources °Organization         Address  Phone  Notes  °[facility]  (888) 581-9988   °Julie Brannon, PhD [address] [place], [place]   (336) 349-5553 or (336) 951-0000   °[facility]   [address] °[place], [place] (336) 349-4454   °[facility] [address], [place], [place] (336) 342-8316 Insurance/Medicaid/sponsorship through [facility]  °[facility] [address]                                    [place], [place] (336) 342-8316 Therapy/tele-psych/case    °[facility] [address]  ° [place], [place] (336) 349-2233    °Dr. Arfeen  (336) 349-4544   °[facility] [place]  [employer] [place] [facility] 1) [address], [place] °2) [address], [place] °3)  [address], [place] (336) 349-3220 °(336) 342-7768 ° °(336) 342-8140   °[place] [facility] (336) 342-1394 or (336) 342-3537 (After Hours)    ° ° °
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
## 2015-06-22 ENCOUNTER — Emergency Department (HOSPITAL_BASED_OUTPATIENT_CLINIC_OR_DEPARTMENT_OTHER)
Admission: EM | Admit: 2015-06-22 | Discharge: 2015-06-22 | Disposition: A | Discharge status: Home / Self Care | Payer: Self-pay | Attending: Physician Assistant | Admitting: Physician Assistant

## 2015-06-22 ENCOUNTER — Encounter (HOSPITAL_BASED_OUTPATIENT_CLINIC_OR_DEPARTMENT_OTHER): Payer: Self-pay | Admitting: Emergency Medicine

## 2015-06-22 DIAGNOSIS — L03213 Periorbital cellulitis: Secondary | ICD-10-CM

## 2015-06-22 DIAGNOSIS — H00034 Abscess of left upper eyelid: Secondary | ICD-10-CM | POA: Insufficient documentation

## 2015-06-22 DIAGNOSIS — F17210 Nicotine dependence, cigarettes, uncomplicated: Secondary | ICD-10-CM | POA: Insufficient documentation

## 2015-06-22 MED ORDER — TETRACAINE HCL 0.5 % OP SOLN
2.0000 [drp] | Freq: Once | OPHTHALMIC | Status: AC
  Administered 2015-06-22: 2 [drp] via OPHTHALMIC

## 2015-06-22 MED ORDER — OXYCODONE-ACETAMINOPHEN 5-325 MG PO TABS
1.0000 | ORAL_TABLET | Freq: Once | ORAL | Status: AC
  Administered 2015-06-22: 1 via ORAL
  Filled 2015-06-22: qty 1

## 2015-06-22 MED ORDER — CLINDAMYCIN HCL 300 MG PO CAPS: 300.0000 mg | ORAL_CAPSULE | Freq: Three times a day (TID) | ORAL | Status: DC

## 2015-06-22 MED ORDER — CLINDAMYCIN HCL 150 MG PO CAPS
300.0000 mg | ORAL_CAPSULE | Freq: Once | ORAL | Status: AC
  Administered 2015-06-22: 300 mg via ORAL
  Filled 2015-06-22: qty 2

## 2015-06-22 MED ORDER — TETRACAINE HCL 0.5 % OP SOLN
OPHTHALMIC | Status: AC
  Administered 2015-06-22: 2 [drp] via OPHTHALMIC
  Filled 2015-06-22: qty 2

## 2015-06-22 MED ORDER — FLUORESCEIN SODIUM 1 MG OP STRP
ORAL_STRIP | OPHTHALMIC | Status: AC
  Administered 2015-06-22: 1
  Filled 2015-06-22: qty 1

## 2015-06-22 NOTE — ED Notes (Signed)
Pt reports that she was at work yesterday and felt like her was developing a stye in it. She went home and put medication she had from previous stye on the left eye and awoke with increased swelling and drainage from left eye

## 2015-06-22 NOTE — ED Provider Notes (Signed)
CSN: [id number]     Arrival date & time 06/22/15  1026 History   First MD Initiated Contact with Patient 06/22/15 1107     Chief Complaint  Patient presents with  . Eye Problem     (Consider location/radiation/quality/duration/timing/severity/associated sxs/prior Treatment) HPI   Pt here with eye swelling starting last night. Warm compress help mildly. URI 3 weeks ago. Popped a "stye" on her eyelid earlier this week. No fevers. No systemic symtposm.   History reviewed. No pertinent past medical history. Past Surgical History  Procedure Laterality Date  . Cesarean section     History reviewed. No pertinent family history. Social History  Substance Use Topics  . Smoking status: Current Every Day Smoker -- 1.00 packs/day for 20 years    Types: Cigarettes  . Smokeless tobacco: Never Used  . Alcohol Use: Yes     Comment: every other day   OB History    No data available     Review of Systems  Constitutional: Negative for activity change.  HENT: Positive for facial swelling. Negative for congestion, dental problem, ear discharge, ear pain, postnasal drip and sinus pressure.   Eyes: Positive for photophobia, discharge and redness.  Respiratory: Negative for shortness of breath.   Cardiovascular: Negative for chest pain.  Gastrointestinal: Negative for abdominal pain.  Genitourinary: Negative for dysuria.  Allergic/Immunologic: Negative for immunocompromised state.  Neurological: Negative for headaches.  Psychiatric/Behavioral: Negative for agitation.      Allergies  Review of patient's allergies indicates no known allergies.  Home Medications   Prior to Admission medications   Medication Sig Start Date End Date Taking? Authorizing Provider  Homeopathic Products [facility](SIMILASAN STYE EYE RELIEF OP) Apply to eye.   Yes Historical Provider, MD  ibuprofen (ADVIL,MOTRIN) 200 MG tablet Take 600 mg by mouth every 6 (six) hours as needed for mild pain.    Historical Provider, MD   promethazine (PHENERGAN) 25 MG tablet Take 1 tablet (25 mg total) by mouth every 6 (six) hours as needed for nausea or vomiting. 06/25/14   Azalia BilisKevin Campos, MD   BP 110/92 mmHg  Pulse 85  Temp(Src) 98.3 F (36.8 C) (Oral)  Resp 18  Ht 5' 1.5" (1.562 m)  Wt 140 lb (63.504 kg)  BMI 26.03 kg/m2  SpO2 100%  LMP 06/21/2015 Physical Exam  Constitutional: She is oriented to person, place, and time. She appears well-developed and well-nourished.  HENT:  Head: Normocephalic and atraumatic.  Swelling to upper eye lif with erythema. EOMI , no pain with EOMI. No discharge,   Eyes: Conjunctivae are normal. Right eye exhibits no discharge.  Neck: Neck supple.  Cardiovascular: Normal rate, regular rhythm and normal heart sounds.   No murmur heard. Pulmonary/Chest: Effort normal and breath sounds normal. She has no wheezes. She has no rales.  Neurological: She is oriented to person, place, and time. No cranial nerve deficit.  Skin: Skin is warm and dry. No rash noted. She is not diaphoretic.  Psychiatric: She has a normal mood and affect. Her behavior is normal.  Nursing note and vitals reviewed.   ED Course  Procedures (including critical care time) Labs Review Labs Reviewed - No data to display  Imaging Review No results found. I have personally reviewed and evaluated these images and lab results as part of my medical decision-making.   EKG Interpretation None      MDM   Final diagnoses:  None   Patient is a [age]year old female who developed eye swelling last  night. Pt had a stye which she popped last week, last night developed swelling. Pt's eye is tearing, no discharge. Not a contact wearer, no FB. Patietn has EOMI with no pain, no proptosis.  Pre septal cellulitis is the most likely.  Not likely orbtial cellulitis given normal eye movements and lack of pain, proptosis.  Will have her take abx, quick follow up with PCP in 2 days.  No conjunctal infection/ertyehma.   Marvel Mcphillips Randall An, MD 06/22/15 1150

## 2015-06-22 NOTE — Discharge Instructions (Signed)
Please return immediately if you develops any pain when moving or either right or left or the redness gets worse. If it spreads please return. High fevers please return. Please follow-up with physician in 2 days.

## 2015-06-24 ENCOUNTER — Encounter (HOSPITAL_BASED_OUTPATIENT_CLINIC_OR_DEPARTMENT_OTHER): Payer: Self-pay | Admitting: *Deleted

## 2015-06-24 ENCOUNTER — Emergency Department (HOSPITAL_BASED_OUTPATIENT_CLINIC_OR_DEPARTMENT_OTHER)
Admission: EM | Admit: 2015-06-24 | Discharge: 2015-06-24 | Disposition: A | Discharge status: Home / Self Care | Payer: Self-pay | Attending: Emergency Medicine | Admitting: Emergency Medicine

## 2015-06-24 DIAGNOSIS — Z720 Tobacco use: Secondary | ICD-10-CM | POA: Insufficient documentation

## 2015-06-24 DIAGNOSIS — H0014 Chalazion left upper eyelid: Secondary | ICD-10-CM | POA: Insufficient documentation

## 2015-06-24 MED ORDER — ERYTHROMYCIN 5 MG/GM OP OINT: TOPICAL_OINTMENT | OPHTHALMIC | Status: DC

## 2015-06-24 NOTE — Discharge Instructions (Signed)
Chalazion A chalazion is a swelling or lump on the eyelid. It can affect the upper or lower eyelid. CAUSES This condition may be caused by:  Long-lasting (chronic) inflammation of the eyelid glands.  A blocked oil gland in the eyelid. SYMPTOMS Symptoms of this condition include:  A swelling on the eyelid. The swelling may spread to areas around the eye.  A hard lump on the eyelid. This lump may make it hard to see out of the eye. DIAGNOSIS This condition is diagnosed with an examination of the eye. TREATMENT This condition is treated by applying a warm compress to the eyelid. If the condition does not improve after two days, it may be treated with:  Surgery.  Medicine that is injected into the chalazion by a health care provider.  Medicine that is applied to the eye. HOME CARE INSTRUCTIONS  Do not touch the chalazion.  Do not try to remove the pus, such as by squeezing the chalazion or sticking it with a pin or needle.  Do not rub your eyes.  Wash your hands often. Dry your hands with a clean towel.  Keep your face, scalp, and eyebrows clean.  Avoid wearing eye makeup.  Apply a warm, moist compress to the eyelid 4-6 times a day for 10-15 minutes at a time. This will help to open any blocked glands and help to reduce redness and swelling.  Apply over-the-counter and prescription medicines only as told by your health care provider.  If the chalazion does not break open (rupture) on its own in a month, return to your health care provider.  Keep all follow-up appointments as told by your health care provider. This is important. SEEK MEDICAL CARE IF:  Your eyelid has not improved in 4 weeks.  Your eyelid is getting worse.  You have a fever.  The chalazion does not rupture on its own with home treatment in a month. SEEK IMMEDIATE MEDICAL CARE IF:  You have pain in your eye.  Your vision changes.  The chalazion becomes painful or red  The chalazion gets  bigger.   This information is not intended to replace advice given to you by your health care provider. Make sure you discuss any questions you have with your health care provider.   Document Released: 08/03/2000 Document Revised: 04/27/2015 Document Reviewed: 11/29/2014 Elsevier Interactive Patient Education 2016 Elsevier Inc.  

## 2015-06-24 NOTE — ED Notes (Signed)
Pt. Seen on Wed. This week for eye swelling of L eye lid.  Pt. Was told to return.  Pt. Was given Cleocin orally for L eye.  Pt. Reports she feels eye has gotten worse.

## 2015-06-24 NOTE — ED Provider Notes (Signed)
CSN: [id number]     Arrival date & time 06/24/15  1600 History   First MD Initiated Contact with Patient 06/24/15 1609     Chief Complaint  Patient presents with  . Eye Problem    HPI Patient presents to the emergency room for persistent swelling of her left eyelid. Patient was seen in the emergency room on Wednesday for the symptoms. She initially noticed the eyelid swelling the night prior to her visit. She had noticed a stye that she popped on her eyelid previously. Patient developed a lump and swollen area on her upper eyelid on the left side. It is painful and tender to the touch. He has not had any fevers. She denies any vomiting or diarrhea. She was seen in the emergency room on November 2. She was started on oral clindamycin. She was instructed to follow-up with her primary care doctor in 2 days. Patient came back to the emergency room here to be rechecked. Not feel like it has gotten any better. Has not really gotten any worse. History reviewed. No pertinent past medical history. Past Surgical History  Procedure Laterality Date  . Cesarean section     No family history on file. Social History  Substance Use Topics  . Smoking status: Current Every Day Smoker -- 1.00 packs/day for 20 years    Types: Cigarettes  . Smokeless tobacco: Never Used  . Alcohol Use: Yes     Comment: every other day   OB History    No data available     Review of Systems  All other systems reviewed and are negative.     Allergies  Review of patient's allergies indicates no known allergies.  Home Medications   Prior to Admission medications   Medication Sig Start Date End Date Taking? Authorizing Provider  clindamycin (CLEOCIN) 300 MG capsule Take 1 capsule (300 mg total) by mouth 3 (three) times daily. 06/22/15   Courteney Lyn Mackuen, MD  erythromycin ophthalmic ointment Place a 1/2 inch ribbon of ointment into the lower eyelid. 06/24/15   Linwood Dibbles, MD  [employer] [facility] STYE EYE  RELIEF OP) Apply to eye.    Historical Provider, MD  ibuprofen (ADVIL,MOTRIN) 200 MG tablet Take 600 mg by mouth every 6 (six) hours as needed for mild pain.    Historical Provider, MD  promethazine (PHENERGAN) 25 MG tablet Take 1 tablet (25 mg total) by mouth every 6 (six) hours as needed for nausea or vomiting. 06/25/14   Azalia Bilis, MD   BP 119/91 mmHg  Pulse 87  Temp(Src) 98.5 F (36.9 C) (Oral)  Resp 18  Ht 5' 1.5" (1.562 m)  Wt 135 lb (61.236 kg)  BMI 25.10 kg/m2  SpO2 99%  LMP 06/21/2015 Physical Exam  Constitutional: She appears well-developed and well-nourished. No distress.  HENT:  Head: Normocephalic and atraumatic.  Right Ear: External ear normal.  Left Ear: External ear normal.  Eyes: Conjunctivae are normal. Pupils are equal, round, and reactive to light. Right eye exhibits no discharge. Left eye exhibits no discharge. Left conjunctiva is not injected. Left conjunctiva has no hemorrhage. No scleral icterus.  Chalazion of the left upper eyelid, tenderness palpation  Neck: Neck supple. No tracheal deviation present.  Cardiovascular: Normal rate.   Pulmonary/Chest: Effort normal. No stridor. No respiratory distress.  Musculoskeletal: She exhibits no edema.  Neurological: She is alert. Cranial nerve deficit: no gross deficits.  Skin: Skin is warm and dry. No rash noted.  Psychiatric: She has a normal  mood and affect.  Nursing note and vitals reviewed.   ED Course  Procedures (including critical care time) Labs Review Labs Reviewed - No data to display  Imaging Review No results found. I have personally reviewed and evaluated these images and lab results as part of my medical decision-making.   EKG Interpretation None      MDM   Final diagnoses:  Chalazion of left upper eyelid    Patient's exam is consistent with a chalazion. Explained to her that it will most likely take longer than a few days for this to get better. Her exam does not suggest cellulitis.  The erythema seems to be confined to the swollen area of the eyelid. I will add a topical antibiotic just to help with some crusting that she has noticed. I recommend follow-up with an ophthalmologist if the symptoms do not improve.    Linwood DibblesJon Fermina Mishkin, MD 06/24/15 [phone]

## 2015-10-29 IMAGING — CR DG HAND COMPLETE 3+V*R*
3 series · 3 of 3 positions shown · non-contrast
Comparison: None.

CLINICAL DATA: Hand pain after trauma

EXAM:
RIGHT HAND - COMPLETE 3+ VIEW

[x hand pa right]
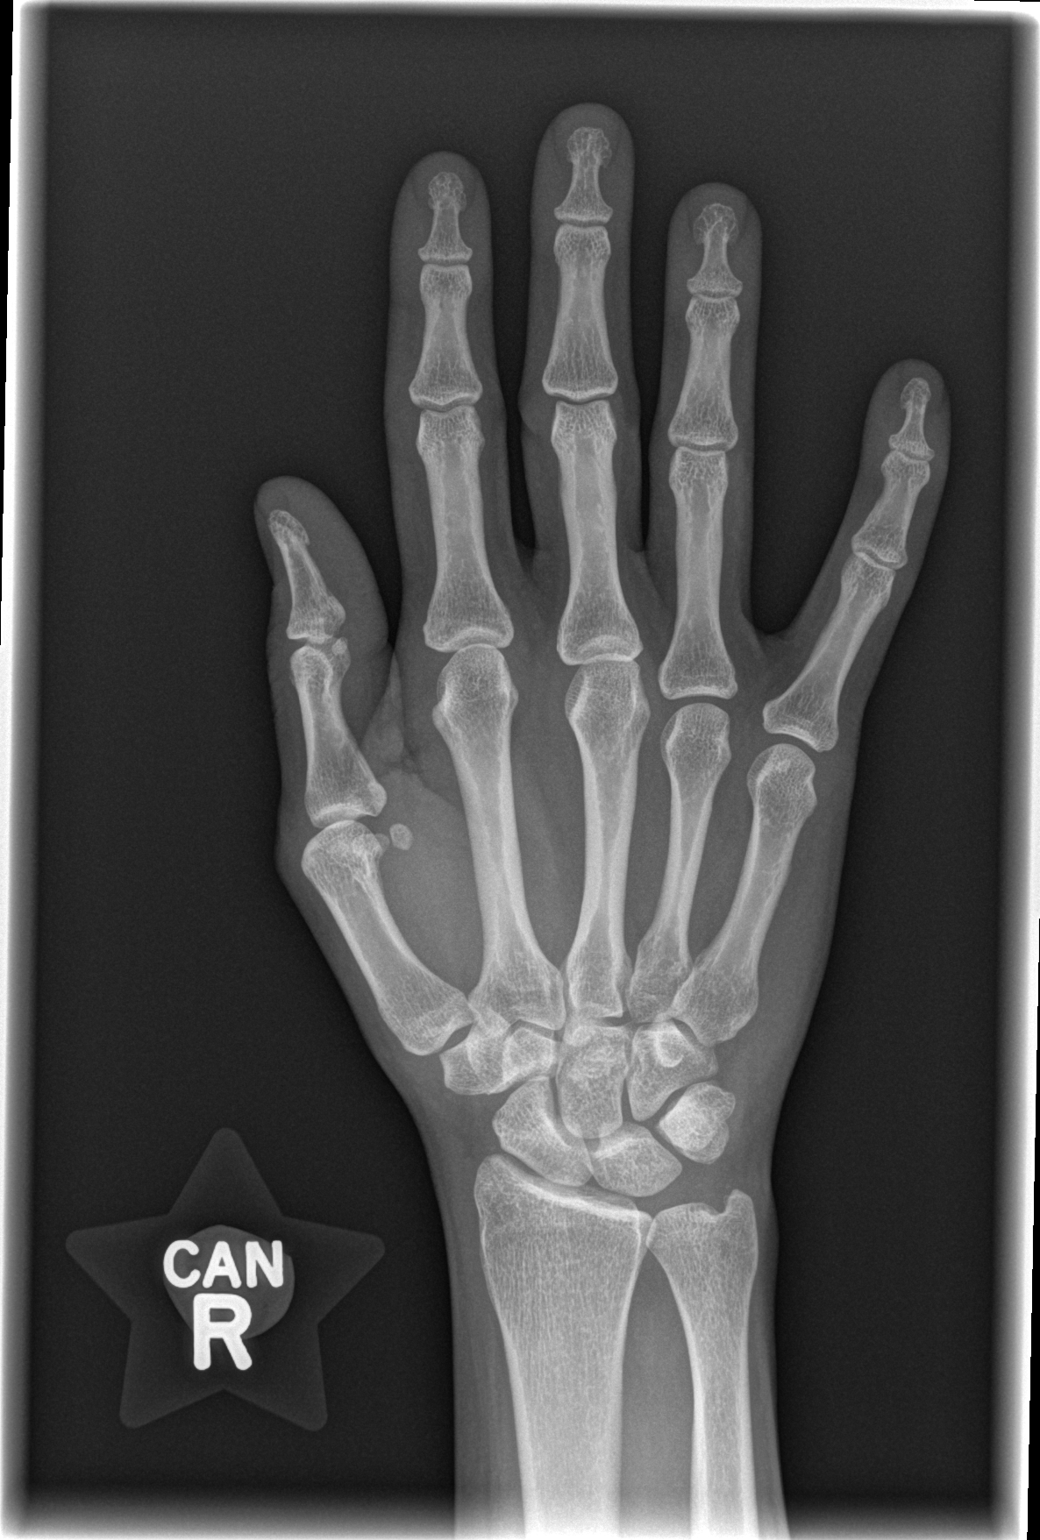

[x hand oblique right]
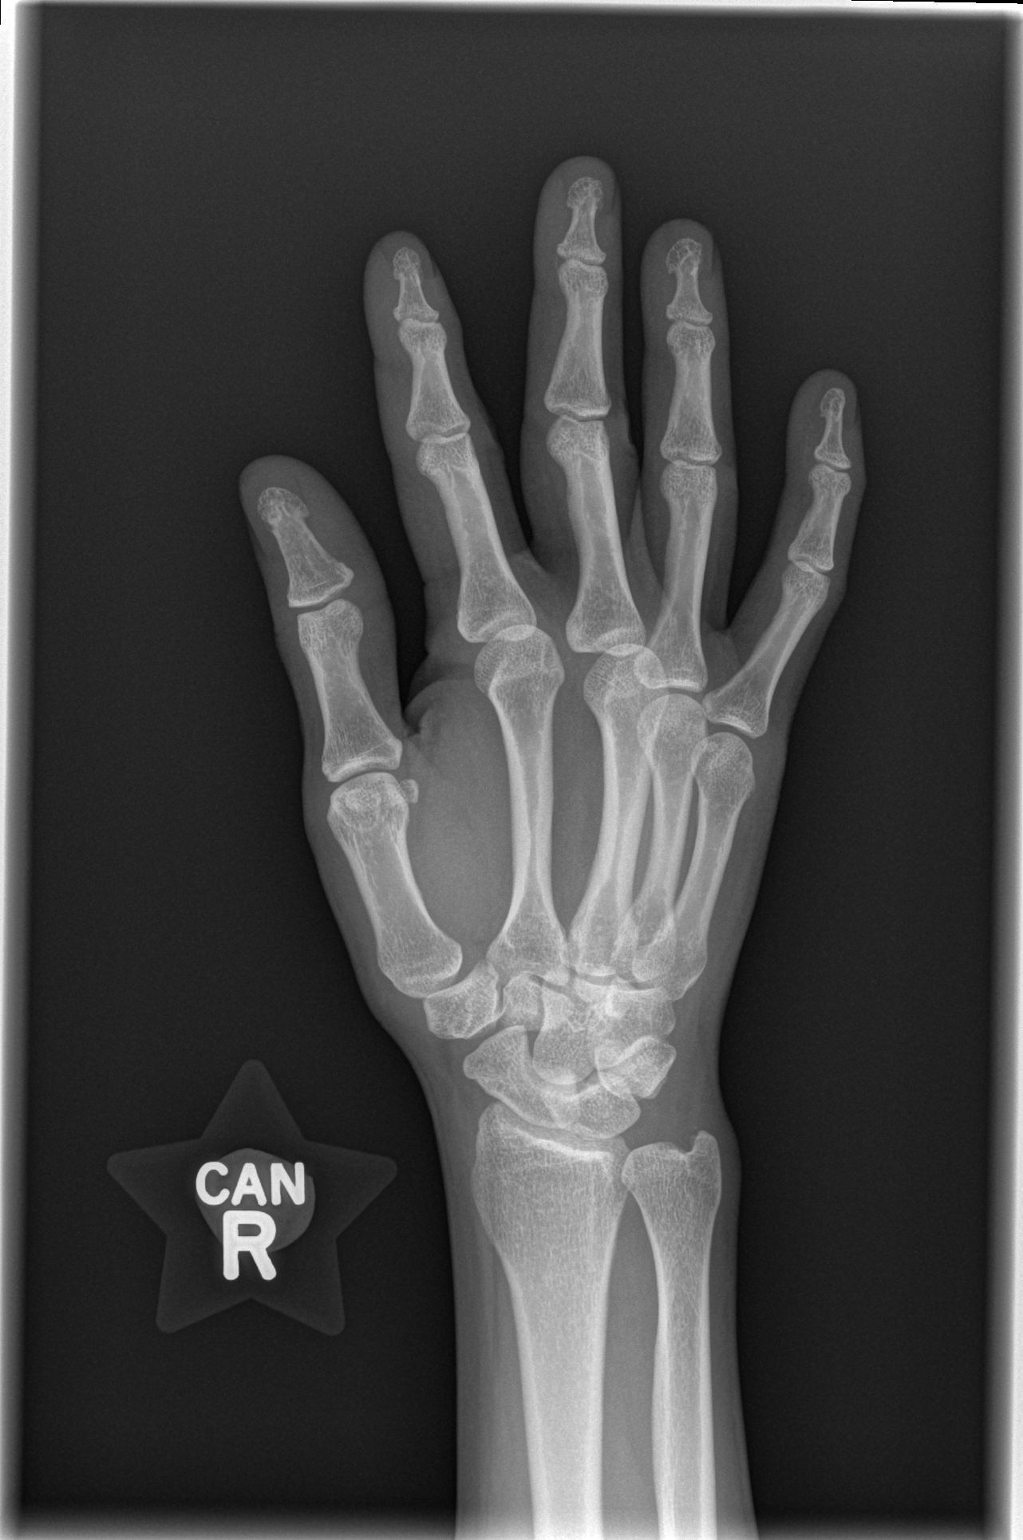

[x hand lat right]
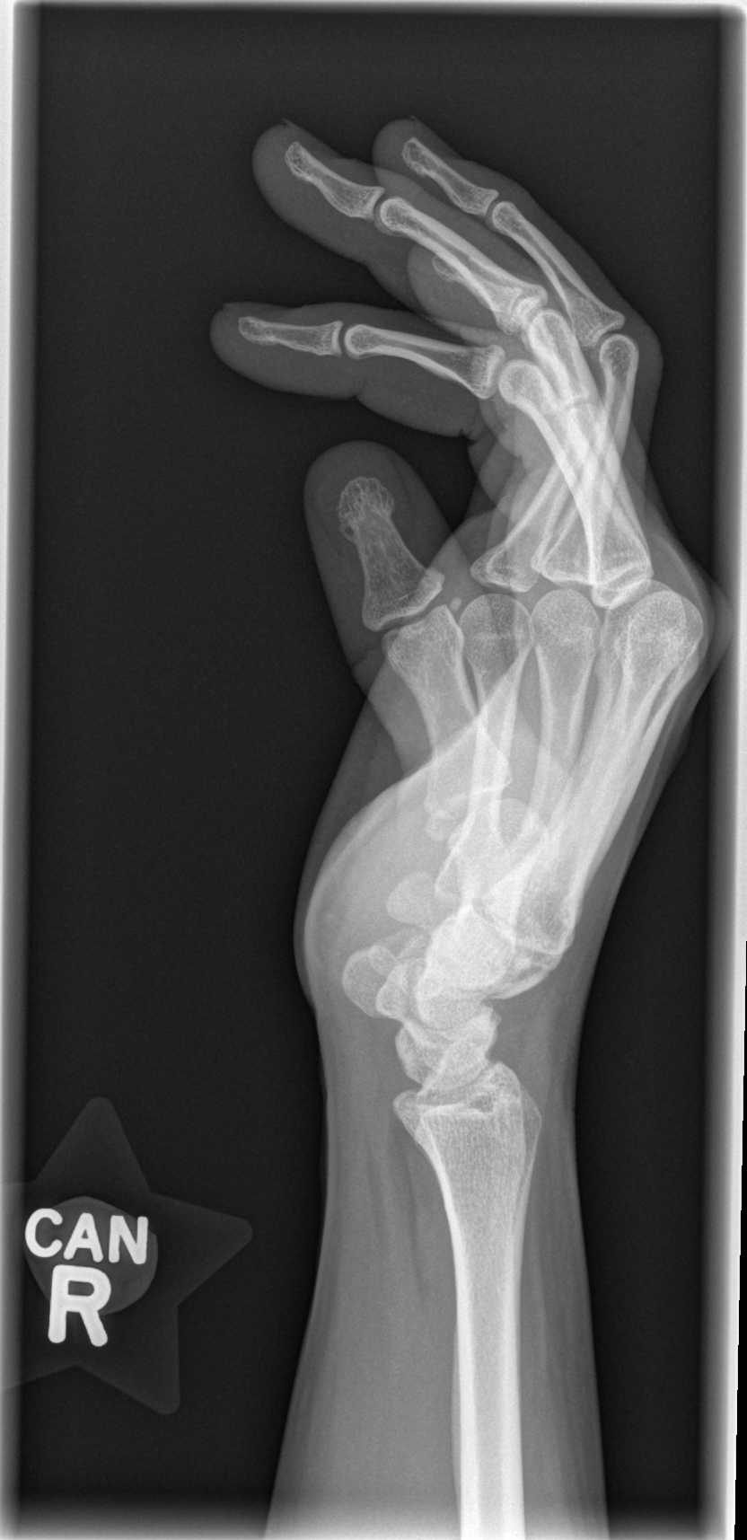

[3 of 3 positions shown; findings below may reference images not displayed]

FINDINGS: There is no evidence of fracture or dislocation. No joint narrowing.
IMPRESSION: Negative.

## 2019-03-27 ENCOUNTER — Emergency Department (HOSPITAL_BASED_OUTPATIENT_CLINIC_OR_DEPARTMENT_OTHER)
Admission: EM | Admit: 2019-03-27 | Discharge: 2019-03-27 | Disposition: A | Discharge status: Home / Self Care | Payer: Self-pay | Attending: Emergency Medicine | Admitting: Emergency Medicine

## 2019-03-27 ENCOUNTER — Other Ambulatory Visit: Payer: Self-pay

## 2019-03-27 ENCOUNTER — Encounter (HOSPITAL_BASED_OUTPATIENT_CLINIC_OR_DEPARTMENT_OTHER): Payer: Self-pay | Admitting: Emergency Medicine

## 2019-03-27 DIAGNOSIS — L0201 Cutaneous abscess of face: Secondary | ICD-10-CM | POA: Insufficient documentation

## 2019-03-27 DIAGNOSIS — F17210 Nicotine dependence, cigarettes, uncomplicated: Secondary | ICD-10-CM | POA: Insufficient documentation

## 2019-03-27 LAB — PREGNANCY, URINE: Preg Test, Ur: NEGATIVE

## 2019-03-27 MED ORDER — DOXYCYCLINE HYCLATE 100 MG PO CAPS: 100.0000 mg | ORAL_CAPSULE | Freq: Two times a day (BID) | ORAL | 0 refills | Status: DC

## 2019-03-27 MED ORDER — DOXYCYCLINE HYCLATE 100 MG PO TABS
100.0000 mg | ORAL_TABLET | Freq: Once | ORAL | Status: AC
  Administered 2019-03-27: 01:00:00 100 mg via ORAL
  Filled 2019-03-27: qty 1

## 2019-03-27 NOTE — ED Notes (Signed)
ED Provider at bedside. 

## 2019-03-27 NOTE — ED Provider Notes (Addendum)
[facility] Provider Note: [name], MD, FACEP  CSN: [id number] MRN: [id number] ARRIVAL: 03/27/19 at 0037 ROOM: [facility]01/[facility]01   CHIEF COMPLAINT  Abscess   HISTORY OF PRESENT ILLNESS  03/27/19 12:50 AM Nancy [name] is a 43 y.o. female with a 2-day history of a tender, swollen nodule on her right cheek.  It began on the skin and has worsened.  She rates associated pain as a 7 out of 10, worse with palpation.  She was able to squeeze some pus out of it yesterday evening.  She had a subjective fever and took 800 mg of ibuprofen with improvement.  She denies any dental pain (she has upper dentures) or intraoral pain or swelling.   History reviewed. No pertinent past medical history.   Past Surgical History:  Procedure Laterality Date  . CESAREAN SECTION      No family history on file.  Social History   Tobacco Use  . Smoking status: Current Every Day Smoker    Packs/day: 1.00    Years: 20.00    Pack years: 20.00    Types: Cigarettes  . Smokeless tobacco: Never Used  Substance Use Topics  . Alcohol use: Yes    Comment: every other day  . Drug use: Yes    Types: Cocaine    Comment: "pills"    Prior to Admission medications   Medication Sig Start Date End Date Taking? Authorizing Provider  promethazine (PHENERGAN) 25 MG tablet Take 1 tablet (25 mg total) by mouth every 6 (six) hours as needed for nausea or vomiting. 06/25/14 03/27/19  Azalia Bilisampos, Kevin, MD    Allergies Patient has no known allergies.   REVIEW OF SYSTEMS  Negative except as noted here or in the History of Present Illness.   PHYSICAL EXAMINATION  Initial Vital Signs Blood pressure 118/81, pulse 86, temperature 98 F (36.7 C), temperature source Oral, resp. rate 16, height 5\' 1"  (1.549 m), weight 51.3 kg, last menstrual period 03/27/2019, SpO2 100 %.  Examination General: Well-developed, well-nourished female in no acute distress; appearance consistent with age of record HENT:  normocephalic; atraumatic; tender, indurated nodule right cheek without fluctuance; mild right preauricular lymphadenopathy; upper dentures; no intraoral lesions seen    Eyes: pupils equal, round and reactive to light; extraocular muscles intact Neck: supple; no lymphadenopathy Heart: regular rate and rhythm Lungs: clear to auscultation bilaterally Abdomen: soft; nondistended; nontender; bowel sounds present Extremities: No deformity; full range of motion; pulses normal Neurologic: Awake, alert and oriented; motor function intact in all extremities and symmetric; no facial droop Skin: Warm and dry Psychiatric: Normal mood and affect   RESULTS  Summary of this visit's results, reviewed by myself:   EKG Interpretation  Date/Time:    Ventricular Rate:    PR Interval:    QRS Duration:   QT Interval:    QTC Calculation:   R Axis:     Text Interpretation:        Laboratory Studies: Results for orders placed or performed during the hospital encounter of 03/27/19 (from the past 24 hour(s))  Pregnancy, urine     Status: None   Collection Time: 03/27/19 12:58 AM  Result Value Ref Range   Preg Test, Ur NEGATIVE NEGATIVE   Imaging Studies: No results found.  ED COURSE and MDM  Nursing notes and initial vitals signs, including pulse oximetry, reviewed.  Vitals:   03/27/19 0044 03/27/19 0045  BP:  118/81  Pulse:  86  Resp:  16  Temp:  86 F (36.7 C)  TempSrc:  Oral  SpO2:  100%  Weight: 51.3 kg   Height: 5\' 1"  (1.549 m)    Lesion is consistent with an abscess but is nonfluctuant, possibly due to the patient expressing pus from it earlier, so we will treat with an antibiotic rather than I&D.  This will also provide a better cosmetic result.  If abscess does not heal with antibiotics an I&D may be required.  PROCEDURES    ED DIAGNOSES     ICD-10-CM   1. Abscess of right external cheek  L02.01        [name], [name], MD 03/27/19 4174    [name], MD 03/27/19  [phone]

## 2019-03-27 NOTE — ED Triage Notes (Signed)
Pt states that she noticed a bump/bite on the right side of her face 2 days ago. She was able to squeeze some puss out of it. States she took 800mg  ibuprofen approx 4 hours. States the swelling is still there and pain has increased. Has dentures so doesn't think it is her teeth. Subjective fever earlier tonight

## 2023-02-12 ENCOUNTER — Emergency Department (HOSPITAL_BASED_OUTPATIENT_CLINIC_OR_DEPARTMENT_OTHER): Payer: PRIVATE HEALTH INSURANCE

## 2023-02-12 ENCOUNTER — Encounter (HOSPITAL_BASED_OUTPATIENT_CLINIC_OR_DEPARTMENT_OTHER): Payer: Self-pay

## 2023-02-12 ENCOUNTER — Emergency Department (HOSPITAL_BASED_OUTPATIENT_CLINIC_OR_DEPARTMENT_OTHER)
Admission: EM | Admit: 2023-02-12 | Discharge: 2023-02-12 | Disposition: A | Discharge status: Home / Self Care | Payer: PRIVATE HEALTH INSURANCE | Attending: Emergency Medicine | Admitting: Emergency Medicine

## 2023-02-12 DIAGNOSIS — L03115 Cellulitis of right lower limb: Secondary | ICD-10-CM | POA: Insufficient documentation

## 2023-02-12 DIAGNOSIS — Z23 Encounter for immunization: Secondary | ICD-10-CM | POA: Diagnosis not present

## 2023-02-12 DIAGNOSIS — M7989 Other specified soft tissue disorders: Secondary | ICD-10-CM | POA: Diagnosis present

## 2023-02-12 MED ORDER — TETANUS-DIPHTH-ACELL PERTUSSIS 5-2.5-18.5 LF-MCG/0.5 IM SUSY
0.5000 mL | PREFILLED_SYRINGE | Freq: Once | INTRAMUSCULAR | Status: AC
  Administered 2023-02-12: 0.5 mL via INTRAMUSCULAR
  Filled 2023-02-12: qty 0.5

## 2023-02-12 MED ORDER — DOXYCYCLINE HYCLATE 100 MG PO CAPS: 100.0000 mg | ORAL_CAPSULE | Freq: Two times a day (BID) | ORAL | 0 refills | Status: AC

## 2023-02-12 MED ORDER — DEXAMETHASONE SODIUM PHOSPHATE 10 MG/ML IJ SOLN
12.0000 mg | Freq: Once | INTRAMUSCULAR | Status: AC
  Administered 2023-02-12: 12 mg via INTRAMUSCULAR
  Filled 2023-02-12: qty 2

## 2023-02-12 MED ORDER — DOXYCYCLINE HYCLATE 100 MG PO TABS
100.0000 mg | ORAL_TABLET | Freq: Once | ORAL | Status: AC
  Administered 2023-02-12: 100 mg via ORAL
  Filled 2023-02-12: qty 1

## 2023-02-12 NOTE — ED Provider Notes (Signed)
[place] [facility] Provider Note   CSN: [id number] Arrival date & time: 02/12/23  1319     History  Chief Complaint  Patient presents with   Insect Bite   Leg Swelling    Mellody L Pree is a 47 y.o. female.  Who presents to the ED for evaluation of right foot pain.  She states she was working in her garden 2 days ago when she felt a bug bite.  She believes this is from an ant.  Bite occurred to the right medial malleolus.  She states since that time she has developed increasing swelling to the right foot and ankle with overlying erythema.  States that this area itches as well.  She denies any allergies.  Reports ankle is painful with ambulation and dorsiflexion/plantarflexion.  She denies any fevers, chills, abdominal pain, nausea, vomiting.  HPI     Home Medications Prior to Admission medications   Medication Sig Start Date End Date Taking? Authorizing Provider  doxycycline (VIBRAMYCIN) 100 MG capsule Take 1 capsule (100 mg total) by mouth 2 (two) times daily for 7 days. 02/12/23 02/19/23 Yes Thaddeus Evitts, Edsel Petrin, PA-C  promethazine (PHENERGAN) 25 MG tablet Take 1 tablet (25 mg total) by mouth every 6 (six) hours as needed for nausea or vomiting. 06/25/14 03/27/19  Azalia Bilis, MD      Allergies    Patient has no known allergies.    Review of Systems   Review of Systems  Musculoskeletal:  Positive for arthralgias and joint swelling.  All other systems reviewed and are negative.   Physical Exam Updated Vital Signs BP (!) 136/100 (BP Location: Right Arm)   Pulse 93   Temp 98.3 F (36.8 C) (Oral)   Resp 16   Ht 5' (1.524 m)   Wt 56.7 kg   SpO2 98%   BMI 24.41 kg/m  Physical Exam Vitals and nursing note reviewed.  Constitutional:      General: She is not in acute distress.    Appearance: Normal appearance. She is normal weight. She is not ill-appearing.  HENT:     Head: Normocephalic and atraumatic.  Pulmonary:     Effort:  Pulmonary effort is normal. No respiratory distress.  Abdominal:     General: Abdomen is flat.  Musculoskeletal:        General: Normal range of motion.     Cervical back: Neck supple.     Comments: Moderate swelling to the right foot and ankle.  Sensation intact in all digits.  Capillary refill normal.  DP pulse 1+ on the right 2+ on the left.  No calf TTP.  Skin:    General: Skin is warm and dry.     Comments: Erythema to the right foot and ankle.  Small fluid-filled blister to the anterior ankle  Neurological:     Mental Status: She is alert and oriented to person, place, and time.  Psychiatric:        Mood and Affect: Mood normal.        Behavior: Behavior normal.     ED Results / Procedures / Treatments   Labs (all labs ordered are listed, but only abnormal results are displayed) Labs Reviewed - No data to display  EKG None  Radiology DG Foot Complete Right  Result Date: 02/12/2023 CLINICAL DATA:  Acute right foot swelling. EXAM: RIGHT FOOT COMPLETE - 3+ VIEW COMPARISON:  None Available. FINDINGS: There is no evidence of fracture or dislocation. There is  no evidence of arthropathy or other focal bone abnormality. Soft tissues are unremarkable. IMPRESSION: Negative. Electronically Signed   By: Lupita Raider M.D.   On: 02/12/2023 14:08    Procedures Procedures    Medications Ordered in ED Medications  doxycycline (VIBRA-TABS) tablet 100 mg (100 mg Oral Given 02/12/23 1422)  dexamethasone (DECADRON) injection 12 mg (12 mg Intramuscular Given 02/12/23 1423)  Tdap (BOOSTRIX) injection 0.5 mL (0.5 mLs Intramuscular Given 02/12/23 1445)    ED Course/ Medical Decision Making/ A&P                             Medical Decision Making Amount and/or Complexity of Data Reviewed Radiology: ordered.  Risk Prescription drug management.  This patient presents to the ED for concern of right foot and ankle pain and swelling, this involves an extensive number of treatment options,  and is a complaint that carries with it a high risk of complications and morbidity.  The differential diagnosis includes cellulitis, septic arthritis, reaction, DVT  My initial workup includes x-ray  Additional history obtained from: Nursing notes from this visit.  I ordered imaging studies including x-ray right foot I independently visualized and interpreted imaging which showed no acute findings I agree with the radiologist interpretation  Afebrile, hemodynamically stable.  47 year old female presenting to the ED for evaluation of right foot and ankle, swelling, erythema.  She states she was bit by 2 days prior to this.  She has had progressive swelling and erythema since that time.  Erythema appears to be localized to the right foot and ankle with associated swelling.  Her neurovascular status is intact.  She denies any signs or symptoms of a systemic infection.  There does appear to be a puncture wound to the right medial malleolus consistent with a tiny penetrating wound, perhaps a bug bite.  She is able to ambulate.  X-rays negative for any effusion or significant soft tissue swelling, however she does have obvious swelling on physical exam.  Overall I am highly suspicious for cellulitis, however she was also complaining of significant itching to this area.  There may be an allergic reaction component as well given her symptoms and blister.  She was treated with Decadron in the ED for this.  DVT is on the differential but considered significantly less likely given the localized erythema and swelling to the foot and ankle.  Septic arthritis was also considered, however x-ray shows no effusion. she was sent a prescription for doxycycline and given her first dose in the ED.  Tetanus updated. Wound was outlined with a skin marker in the ED as well.  She was encouraged to follow-up with her PCP in 4 to 5 days for reevaluation.  She was given strict return precautions.  Stable at discharge.  At this  time there does not appear to be any evidence of an acute emergency medical condition and the patient appears stable for discharge with appropriate outpatient follow up. Diagnosis was discussed with patient who verbalizes understanding of care plan and is agreeable to discharge. I have discussed return precautions with patient who verbalizes understanding. Patient encouraged to follow-up with their PCP within 5 days. All questions answered.  Note: Portions of this report may have been transcribed using voice recognition software. Every effort was made to ensure accuracy; however, inadvertent computerized transcription errors may still be present.        Final Clinical Impression(s) / ED Diagnoses Final diagnoses:  Cellulitis of right lower leg    Rx / DC Orders ED Discharge Orders          Ordered    doxycycline (VIBRAMYCIN) 100 MG capsule  2 times daily        02/12/23 [address], Edsel Petrin, Cordelia Poche 02/12/23 1514    Virgina Norfolk, DO 02/12/23 2247

## 2023-02-12 NOTE — Discharge Instructions (Addendum)
You have been seen today for your complaint of right foot and ankle redness and swelling. Your imaging was reassuring. Your discharge medications include doxycycline. This is an antibiotic. You should take it as prescribed. You should take it for the entire duration of the prescription. This may cause an upset stomach. This is normal. You may take this with food. You may also eat yogurt to prevent diarrhea. Follow up with: Your primary care doctor in approximately 5 days Please seek immediate medical care if you develop any of the following symptoms: You notice red streaks coming from the infected area. You notice the skin turns purple or black and falls off. At this time there does not appear to be the presence of an emergent medical condition, however there is always the potential for conditions to change. Please read and follow the below instructions.  Do not take your medicine if  develop an itchy rash, swelling in your mouth or lips, or difficulty breathing; call 911 and seek immediate emergency medical attention if this occurs.  You may review your lab tests and imaging results in their entirety on your MyChart account.  Please discuss all results of fully with your primary care provider and other specialist at your follow-up visit.  Note: Portions of this text may have been transcribed using voice recognition software. Every effort was made to ensure accuracy; however, inadvertent computerized transcription errors may still be present.

## 2023-02-12 NOTE — ED Triage Notes (Signed)
States was bitten by something while picking weeds on Sunday. Has had increasing swelling to lower right leg. Foot swollen, red and painful, swelling traveling up leg.

## 2023-02-12 NOTE — ED Notes (Signed)
Pt states was pulling weeds on Sunday felt a bite and and then yesterday rt foot and lower leg re itchy and swollen from foot to 1/2 way up leg, areas of blisters, she has no idea what it was but felt it was ants , has never had anything like this before ,

## 2023-05-22 ENCOUNTER — Encounter (HOSPITAL_BASED_OUTPATIENT_CLINIC_OR_DEPARTMENT_OTHER): Payer: Self-pay

## 2023-05-22 ENCOUNTER — Other Ambulatory Visit: Payer: Self-pay

## 2023-05-22 ENCOUNTER — Emergency Department (HOSPITAL_BASED_OUTPATIENT_CLINIC_OR_DEPARTMENT_OTHER)
Admission: EM | Admit: 2023-05-22 | Discharge: 2023-05-22 | Disposition: A | Discharge status: Home / Self Care | Payer: No Typology Code available for payment source

## 2023-05-22 DIAGNOSIS — S90562A Insect bite (nonvenomous), left ankle, initial encounter: Secondary | ICD-10-CM | POA: Diagnosis present

## 2023-05-22 DIAGNOSIS — W57XXXA Bitten or stung by nonvenomous insect and other nonvenomous arthropods, initial encounter: Secondary | ICD-10-CM | POA: Diagnosis not present

## 2023-05-22 MED ORDER — DOXYCYCLINE HYCLATE 100 MG PO CAPS: 100.0000 mg | ORAL_CAPSULE | Freq: Two times a day (BID) | ORAL | 0 refills | Status: DC

## 2023-05-22 MED ORDER — EPINEPHRINE 0.3 MG/0.3ML IJ SOAJ: 0.3000 mg | INTRAMUSCULAR | 0 refills | Status: AC | PRN

## 2023-05-22 MED ORDER — DEXAMETHASONE SODIUM PHOSPHATE 10 MG/ML IJ SOLN
10.0000 mg | Freq: Once | INTRAMUSCULAR | Status: AC
  Administered 2023-05-22: 10 mg via INTRAMUSCULAR
  Filled 2023-05-22: qty 1

## 2023-05-22 NOTE — ED Notes (Signed)
EDP seen pt in Triage.

## 2023-05-22 NOTE — ED Provider Notes (Signed)
[place] [facility] Provider Note   CSN: [id number] Arrival date & time: 05/22/23  1759     History  No chief complaint on file.   Nancy [name] is a 47 y.o. female.  47 year old female presents with concern for ant bites to the left ankle.  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  States she was seen here for same a few months ago, provided with antibiotics and a shot and her symptoms resolved quickly.  Last tetanus is less than 5 years ago.  No other complaints or concerns.       Home Medications Prior to Admission medications   Medication Sig Start Date End Date Taking? Authorizing Provider  doxycycline (VIBRAMYCIN) 100 MG capsule Take 1 capsule (100 mg total) by mouth 2 (two) times daily. 05/22/23  Yes Jeannie Fend, PA-C  promethazine (PHENERGAN) 25 MG tablet Take 1 tablet (25 mg total) by mouth every 6 (six) hours as needed for nausea or vomiting. 06/25/14 03/27/19  Azalia Bilis, MD      Allergies    Patient has no known allergies.    Review of Systems   Review of Systems Negative except as per HPI Physical Exam Updated Vital Signs BP (!) 145/91 (BP Location: Left Arm)   Pulse 95   Temp 97.9 F (36.6 C)   Resp 18   Ht 5' (1.524 m)   Wt 56.7 kg   SpO2 100%   BMI 24.41 kg/m  Physical Exam Vitals and nursing note reviewed.  Constitutional:      General: She is not in acute distress.    Appearance: She is well-developed. She is not diaphoretic.  HENT:     Head: Normocephalic and atraumatic.  Cardiovascular:     Pulses: Normal pulses.  Pulmonary:     Effort: Pulmonary effort is normal.  Musculoskeletal:        General: Swelling present.     Comments: Mild swelling and erythema to left medial ankle.  Mild serous drainage noted to posterior ankle.  No streaking.  DP pulse present, sensation intact.  Gait intact.  Skin:    General: Skin is warm.   Neurological:     Mental Status: She is alert and oriented to person, place, and time.  Psychiatric:        Behavior: Behavior normal.     ED Results / Procedures / Treatments   Labs (all labs ordered are listed, but only abnormal results are displayed) Labs Reviewed - No data to display  EKG None  Radiology No results found.  Procedures Procedures    Medications Ordered in ED Medications  dexamethasone (DECADRON) injection 10 mg (has no administration in time range)    ED Course/ Medical Decision Making/ A&P                                 Medical Decision Making  46 year old female with concern for insect bite to the left ankle as above.  Seen here a few months ago with similar, symptoms treated with doxycycline and Decadron IM, requesting same.  Tetanus is up-to-date.        Final Clinical Impression(s) / ED Diagnoses Final diagnoses:  Insect bite of left ankle, initial encounter    Rx / DC Orders ED Discharge Orders          Ordered  doxycycline (VIBRAMYCIN) 100 MG capsule  2 times daily        05/22/23 1815              Jeannie Fend, PA-C 05/22/23 1816    Coral Spikes, DO 05/22/23 2240

## 2023-05-22 NOTE — ED Triage Notes (Signed)
Pt reports that she stepped in an ant bed. Reports that she was just seen for the same thing. Left foot noted to be swollen and red.

## 2023-05-22 NOTE — Discharge Instructions (Addendum)
Take antibiotics as prescribed.  Recheck with PCP if not improving.  Follow-up with [facility] if you do not have a PCP.  Return to the ER for severe or concerning symptoms.

## 2024-02-07 ENCOUNTER — Encounter (HOSPITAL_BASED_OUTPATIENT_CLINIC_OR_DEPARTMENT_OTHER): Payer: Self-pay

## 2024-02-07 ENCOUNTER — Emergency Department (HOSPITAL_BASED_OUTPATIENT_CLINIC_OR_DEPARTMENT_OTHER)

## 2024-02-07 ENCOUNTER — Emergency Department (HOSPITAL_BASED_OUTPATIENT_CLINIC_OR_DEPARTMENT_OTHER)
Admission: EM | Admit: 2024-02-07 | Discharge: 2024-02-07 | Discharge status: Against Medical Advice | Attending: Emergency Medicine | Admitting: Emergency Medicine

## 2024-02-07 ENCOUNTER — Other Ambulatory Visit: Payer: Self-pay

## 2024-02-07 DIAGNOSIS — Z5329 Procedure and treatment not carried out because of patient's decision for other reasons: Secondary | ICD-10-CM | POA: Insufficient documentation

## 2024-02-07 DIAGNOSIS — R600 Localized edema: Secondary | ICD-10-CM | POA: Insufficient documentation

## 2024-02-07 DIAGNOSIS — I509 Heart failure, unspecified: Secondary | ICD-10-CM | POA: Diagnosis not present

## 2024-02-07 DIAGNOSIS — E8770 Fluid overload, unspecified: Secondary | ICD-10-CM

## 2024-02-07 DIAGNOSIS — M7989 Other specified soft tissue disorders: Secondary | ICD-10-CM | POA: Diagnosis present

## 2024-02-07 LAB — CBC
HCT: 44.3 % (ref 36.0–46.0)
Hemoglobin: 14.5 g/dL (ref 12.0–15.0)
MCH: 29.8 pg (ref 26.0–34.0)
MCHC: 32.7 g/dL (ref 30.0–36.0)
MCV: 91 fL (ref 80.0–100.0)
Platelets: 253 10*3/uL (ref 150–400)
RBC: 4.87 MIL/uL (ref 3.87–5.11)
RDW: 13.9 % (ref 11.5–15.5)
WBC: 9.3 10*3/uL (ref 4.0–10.5)
nRBC: 0 % (ref 0.0–0.2)

## 2024-02-07 LAB — URINALYSIS, ROUTINE W REFLEX MICROSCOPIC
Bilirubin Urine: NEGATIVE
Glucose, UA: NEGATIVE mg/dL
Ketones, ur: NEGATIVE mg/dL
Leukocytes,Ua: NEGATIVE
Nitrite: NEGATIVE
Protein, ur: 30 mg/dL — AB
Specific Gravity, Urine: >=1.03 (ref 1.005–1.030)
pH: 5.5 (ref 5.0–8.0)

## 2024-02-07 LAB — COMPREHENSIVE METABOLIC PANEL WITH GFR
ALT: 75 U/L — ABNORMAL HIGH (ref 0–44)
AST: 65 U/L — ABNORMAL HIGH (ref 15–41)
Albumin: 3.6 g/dL (ref 3.5–5.0)
Alkaline Phosphatase: 85 U/L (ref 38–126)
Anion gap: 10 (ref 5–15)
BUN: 25 mg/dL — ABNORMAL HIGH (ref 6–20)
CO2: 24 mmol/L (ref 22–32)
Calcium: 8.6 mg/dL — ABNORMAL LOW (ref 8.9–10.3)
Chloride: 105 mmol/L (ref 98–111)
Creatinine, Ser: 1.13 mg/dL — ABNORMAL HIGH (ref 0.44–1.00)
GFR, Estimated: >60 mL/min (ref >60)
Glucose, Bld: 91 mg/dL (ref 70–99)
Potassium: 4 mmol/L (ref 3.5–5.1)
Sodium: 139 mmol/L (ref 135–145)
Total Bilirubin: 0.4 mg/dL (ref 0.0–1.2)
Total Protein: 5.7 g/dL — ABNORMAL LOW (ref 6.5–8.1)

## 2024-02-07 LAB — URINALYSIS, MICROSCOPIC (REFLEX)

## 2024-02-07 LAB — PRO BRAIN NATRIURETIC PEPTIDE: Pro Brain Natriuretic Peptide: 6349 pg/mL — ABNORMAL HIGH (ref <300.0)

## 2024-02-07 LAB — URINE DRUG SCREEN
Amphetamines: DETECTED — AB
Barbiturates: NOT DETECTED
Benzodiazepines: NOT DETECTED
Cocaine: DETECTED — AB
Fentanyl: NOT DETECTED
Methadone Scn, Ur: NOT DETECTED
Opiates: NOT DETECTED
Tetrahydrocannabinol: NOT DETECTED

## 2024-02-07 LAB — TROPONIN T, HIGH SENSITIVITY
Troponin T High Sensitivity: 15 ng/L (ref <19)
Troponin T High Sensitivity: <15 ng/L (ref <19)

## 2024-02-07 LAB — PREGNANCY, URINE: Preg Test, Ur: NEGATIVE

## 2024-02-07 LAB — LIPASE, BLOOD: Lipase: 28 U/L (ref 11–51)

## 2024-02-07 MED ORDER — FUROSEMIDE 20 MG PO TABS: 20.0000 mg | ORAL_TABLET | Freq: Every day | ORAL | 0 refills | Status: DC

## 2024-02-07 MED ORDER — IOHEXOL 350 MG/ML SOLN
100.0000 mL | Freq: Once | INTRAVENOUS | Status: AC | PRN
  Administered 2024-02-07: 100 mL via INTRAVENOUS

## 2024-02-07 MED ORDER — FUROSEMIDE 10 MG/ML IJ SOLN
60.0000 mg | Freq: Once | INTRAMUSCULAR | Status: AC
  Administered 2024-02-07: 60 mg via INTRAVENOUS
  Filled 2024-02-07: qty 6

## 2024-02-07 NOTE — ED Triage Notes (Signed)
 Pt reports banding of hemorrhoids 2 weeks ago and since lower extremities have been swollen. Also Complaining of left upper abdominal is tender to touch . SOB and no energy   Denies N/V/D

## 2024-02-07 NOTE — ED Notes (Signed)
 Questions and concerns addressed. Discharge teaching completed.   Prescriptions reviewed and pharmacy verified.   Risks of leaving against medical advice explained to patient and patient verbalized understanding. AMA e signature obtained.   Patient ambulatory out of ED with family.

## 2024-02-07 NOTE — ED Notes (Signed)
 Pt was able to ambulate to bathroom and provide urine specimen.

## 2024-02-07 NOTE — Discharge Instructions (Addendum)
 Please return if your symptoms worsen.  I recommend you go to [facility] if you decide to change your mind about admission.  I would take next dose of diuretic tomorrow.  Follow-up with cardiology.  Please return if symptoms worsen.  I do think you have new heart failure.  IMPRESSION:  1. Negative for acute pulmonary embolus or aortic dissection.  Cardiomegaly with reflux of contrast into the hepatic veins  consistent with elevated right heart pressure. Enlarged pulmonary  trunk suggests arterial hypertension.  2. Heterogenous liver enhancement, suspect due to passive  congestion. Indeterminate 2.9 cm enhancing liver mass. When the  patient is clinically stable and able to follow directions and hold  their breath (preferably as an outpatient) further evaluation with  dedicated abdominal MRI should be considered.  3. No calcified gallstones. Nonspecific diffuse gallbladder wall  thickening which may be correlated with ultrasound as indicated  4. Prominent endometrial thickening measuring up to 2.8 cm.  Recommend prompt follow-up with pelvic ultrasound.  5. Small volume ascites within the abdomen and pelvis.  6. Aortic atherosclerosis.

## 2024-02-07 NOTE — ED Provider Notes (Signed)
 [place] [facility] Provider Note   CSN: [id number] Arrival date & time: 02/07/24  1739     Patient presents with: Leg Swelling, Abdominal Pain, and Shortness of Breath   Nancy [name] is a 48 y.o. female.   Patient here with leg swelling, some intermittent shortness of breath left upper abdominal pain at times.  She denies any nausea vomiting diarrhea.  No major medical problems.  Noticed the swelling over the past week.  Denies any chest pain no fever no chills.  Does use alcohol occasionally.  She denies any weakness numbness tingling.  Has not noticed the swelling comes and goes.  Denies history of hypertension high cholesterol diabetes.  The history is provided by the patient.       Prior to Admission medications   Medication Sig Start Date End Date Taking? Authorizing Provider  furosemide (LASIX) 20 MG tablet Take 1 tablet (20 mg total) by mouth daily for 5 days. 02/07/24 02/12/24 Yes Anyla Israelson, DO  doxycycline  (VIBRAMYCIN ) 100 MG capsule Take 1 capsule (100 mg total) by mouth 2 (two) times daily. 05/22/23   Darlis Eisenmenger, PA-C  EPINEPHrine  0.3 mg/0.3 mL IJ SOAJ injection Inject 0.3 mg into the muscle as needed for anaphylaxis. 05/22/23   Denese Finn, PA-C  promethazine  (PHENERGAN ) 25 MG tablet Take 1 tablet (25 mg total) by mouth every 6 (six) hours as needed for nausea or vomiting. 06/25/14 03/27/19  Nannette Babe, MD    Allergies: Patient has no known allergies.    Review of Systems  Updated Vital Signs BP (!) 143/108   Pulse 92   Temp 98 F (36.7 C) (Oral)   Resp 13   Wt 54.9 kg   SpO2 98%   BMI 23.63 kg/m   Physical Exam Vitals and nursing note reviewed.  Constitutional:      General: She is not in acute distress.    Appearance: She is well-developed. She is not ill-appearing.  HENT:     Head: Normocephalic and atraumatic.     Mouth/Throat:     Mouth: Mucous membranes are moist.   Eyes:     Extraocular  Movements: Extraocular movements intact.     Conjunctiva/sclera: Conjunctivae normal.    Cardiovascular:     Rate and Rhythm: Normal rate and regular rhythm.     Heart sounds: Normal heart sounds. No murmur heard. Pulmonary:     Effort: Pulmonary effort is normal. No respiratory distress.     Breath sounds: Normal breath sounds.  Abdominal:     General: There is no distension.     Palpations: Abdomen is soft.     Tenderness: There is no abdominal tenderness.   Musculoskeletal:        General: No swelling.     Cervical back: Neck supple.   Skin:    General: Skin is warm and dry.     Capillary Refill: Capillary refill takes less than 2 seconds.     Comments: Maybe trace edema bilateral lower extremities and symmetric   Neurological:     General: No focal deficit present.     Mental Status: She is alert.     Cranial Nerves: No cranial nerve deficit.     Motor: No weakness.   Psychiatric:        Mood and Affect: Mood normal.     (all labs ordered are listed, but only abnormal results are displayed) Labs Reviewed  COMPREHENSIVE METABOLIC PANEL WITH GFR -  Abnormal; Notable for the following components:      Result Value   BUN 25 (*)    Creatinine, Ser 1.13 (*)    Calcium 8.6 (*)    Total Protein 5.7 (*)    AST 65 (*)    ALT 75 (*)    All other components within normal limits  URINALYSIS, ROUTINE W REFLEX MICROSCOPIC - Abnormal; Notable for the following components:   Hgb urine dipstick MODERATE (*)    Protein, ur 30 (*)    All other components within normal limits  PRO BRAIN NATRIURETIC PEPTIDE - Abnormal; Notable for the following components:   Pro Brain Natriuretic Peptide 6,349.0 (*)    All other components within normal limits  URINALYSIS, MICROSCOPIC (REFLEX) - Abnormal; Notable for the following components:   Bacteria, UA FEW (*)    All other components within normal limits  URINE DRUG SCREEN - Abnormal; Notable for the following components:   Cocaine DETECTED  (*)    Amphetamines DETECTED (*)    All other components within normal limits  LIPASE, BLOOD  CBC  PREGNANCY, URINE  TROPONIN T, HIGH SENSITIVITY  TROPONIN T, HIGH SENSITIVITY    EKG: None  Radiology: CT Angio Chest PE W and/or Wo Contrast Result Date: 02/07/2024 CLINICAL DATA:  Left upper quadrant pain short of breath EXAM: CT ANGIOGRAPHY CHEST CT ABDOMEN AND PELVIS WITH CONTRAST TECHNIQUE: Multidetector CT imaging of the chest was performed using the standard protocol during bolus administration of intravenous contrast. Multiplanar CT image reconstructions and MIPs were obtained to evaluate the vascular anatomy. Multidetector CT imaging of the abdomen and pelvis was performed using the standard protocol during bolus administration of intravenous contrast. RADIATION DOSE REDUCTION: This exam was performed according to the departmental dose-optimization program which includes automated exposure control, adjustment of the mA and/or kV according to patient size and/or use of iterative reconstruction technique. CONTRAST:  OMNIPAQUE IOHEXOL 350 MG/ML SOLN COMPARISON:  Chest x-ray 02/07/2024, FINDINGS: CTA CHEST FINDINGS Cardiovascular: Satisfactory opacification of the pulmonary arteries to the segmental level. No evidence of pulmonary embolism. Cardiomegaly. Dilated pulmonary trunk measuring up to 3.9 cm. No pericardial effusion. Reflux of contrast into the hepatic veins consistent with elevated right heart pressure Mediastinum/Nodes: No enlarged mediastinal, hilar, or axillary lymph nodes. Thyroid gland, trachea, and esophagus demonstrate no significant findings. Lungs/Pleura: Lungs are clear. No pleural effusion or pneumothorax. Musculoskeletal: No chest wall abnormality. No acute or significant osseous findings. Review of the MIP images confirms the above findings. CT ABDOMEN and PELVIS FINDINGS Hepatobiliary: Heterogenous liver enhancement, potentially due to passive congestion. No calcified  gallstone. Suspicion of gallbladder wall thickening. Hyperenhancing mass anterior to the gallbladder fossa measuring 2.9 by 2.5 cm. No biliary dilatation Pancreas: Unremarkable. No pancreatic ductal dilatation or surrounding inflammatory changes. Spleen: Normal in size without focal abnormality. Adrenals/Urinary Tract: Adrenal glands are unremarkable. Kidneys are normal, without renal calculi, focal lesion, or hydronephrosis. Bladder is unremarkable. Stomach/Bowel: Stomach is within normal limits. Appendix appears normal. No evidence of bowel wall thickening, distention, or inflammatory changes. Vascular/Lymphatic: Aortic atherosclerosis. No enlarged abdominal or pelvic lymph nodes. Circumaortic left renal vein. Reproductive: No adnexal mass. Prominent endometrial thickening measuring up to 2.8 cm. Other: Negative for free air. Small volume free fluid in the abdomen and pelvis. Musculoskeletal: No acute or suspicious osseous abnormality Review of the MIP images confirms the above findings. IMPRESSION: 1. Negative for acute pulmonary embolus or aortic dissection. Cardiomegaly with reflux of contrast into the hepatic veins consistent with elevated  right heart pressure. Enlarged pulmonary trunk suggests arterial hypertension. 2. Heterogenous liver enhancement, suspect due to passive congestion. Indeterminate 2.9 cm enhancing liver mass. When the patient is clinically stable and able to follow directions and hold their breath (preferably as an outpatient) further evaluation with dedicated abdominal MRI should be considered. 3. No calcified gallstones. Nonspecific diffuse gallbladder wall thickening which may be correlated with ultrasound as indicated 4. Prominent endometrial thickening measuring up to 2.8 cm. Recommend prompt follow-up with pelvic ultrasound. 5. Small volume ascites within the abdomen and pelvis. 6. Aortic atherosclerosis. Aortic Atherosclerosis (ICD10-I70.0). Electronically Signed   By: Esmeralda Hedge  M.D.   On: 02/07/2024 20:46   CT ABDOMEN PELVIS W CONTRAST Result Date: 02/07/2024 CLINICAL DATA:  Left upper quadrant pain short of breath EXAM: CT ANGIOGRAPHY CHEST CT ABDOMEN AND PELVIS WITH CONTRAST TECHNIQUE: Multidetector CT imaging of the chest was performed using the standard protocol during bolus administration of intravenous contrast. Multiplanar CT image reconstructions and MIPs were obtained to evaluate the vascular anatomy. Multidetector CT imaging of the abdomen and pelvis was performed using the standard protocol during bolus administration of intravenous contrast. RADIATION DOSE REDUCTION: This exam was performed according to the departmental dose-optimization program which includes automated exposure control, adjustment of the mA and/or kV according to patient size and/or use of iterative reconstruction technique. CONTRAST:  OMNIPAQUE IOHEXOL 350 MG/ML SOLN COMPARISON:  Chest x-ray 02/07/2024, FINDINGS: CTA CHEST FINDINGS Cardiovascular: Satisfactory opacification of the pulmonary arteries to the segmental level. No evidence of pulmonary embolism. Cardiomegaly. Dilated pulmonary trunk measuring up to 3.9 cm. No pericardial effusion. Reflux of contrast into the hepatic veins consistent with elevated right heart pressure Mediastinum/Nodes: No enlarged mediastinal, hilar, or axillary lymph nodes. Thyroid gland, trachea, and esophagus demonstrate no significant findings. Lungs/Pleura: Lungs are clear. No pleural effusion or pneumothorax. Musculoskeletal: No chest wall abnormality. No acute or significant osseous findings. Review of the MIP images confirms the above findings. CT ABDOMEN and PELVIS FINDINGS Hepatobiliary: Heterogenous liver enhancement, potentially due to passive congestion. No calcified gallstone. Suspicion of gallbladder wall thickening. Hyperenhancing mass anterior to the gallbladder fossa measuring 2.9 by 2.5 cm. No biliary dilatation Pancreas: Unremarkable. No pancreatic  ductal dilatation or surrounding inflammatory changes. Spleen: Normal in size without focal abnormality. Adrenals/Urinary Tract: Adrenal glands are unremarkable. Kidneys are normal, without renal calculi, focal lesion, or hydronephrosis. Bladder is unremarkable. Stomach/Bowel: Stomach is within normal limits. Appendix appears normal. No evidence of bowel wall thickening, distention, or inflammatory changes. Vascular/Lymphatic: Aortic atherosclerosis. No enlarged abdominal or pelvic lymph nodes. Circumaortic left renal vein. Reproductive: No adnexal mass. Prominent endometrial thickening measuring up to 2.8 cm. Other: Negative for free air. Small volume free fluid in the abdomen and pelvis. Musculoskeletal: No acute or suspicious osseous abnormality Review of the MIP images confirms the above findings. IMPRESSION: 1. Negative for acute pulmonary embolus or aortic dissection. Cardiomegaly with reflux of contrast into the hepatic veins consistent with elevated right heart pressure. Enlarged pulmonary trunk suggests arterial hypertension. 2. Heterogenous liver enhancement, suspect due to passive congestion. Indeterminate 2.9 cm enhancing liver mass. When the patient is clinically stable and able to follow directions and hold their breath (preferably as an outpatient) further evaluation with dedicated abdominal MRI should be considered. 3. No calcified gallstones. Nonspecific diffuse gallbladder wall thickening which may be correlated with ultrasound as indicated 4. Prominent endometrial thickening measuring up to 2.8 cm. Recommend prompt follow-up with pelvic ultrasound. 5. Small volume ascites within the abdomen and pelvis. 6.  Aortic atherosclerosis. Aortic Atherosclerosis (ICD10-I70.0). Electronically Signed   By: Esmeralda Hedge M.D.   On: 02/07/2024 20:46   DG Chest 2 View Result Date: 02/07/2024 CLINICAL DATA:  Shortness of breath with swelling EXAM: CHEST - 2 VIEW COMPARISON:  Chest x-ray 12/09/2007 FINDINGS: The  heart size and mediastinal contours are within normal limits. Both lungs are clear. The visualized skeletal structures are unremarkable. IMPRESSION: No active cardiopulmonary disease. Electronically Signed   By: Tyron Gallon M.D.   On: 02/07/2024 19:51     Procedures   Medications Ordered in the ED  furosemide (LASIX) injection 60 mg (60 mg Intravenous Given 02/07/24 2025)  iohexol (OMNIPAQUE) 350 MG/ML injection 100 mL (100 mLs Intravenous Contrast Given 02/07/24 2001)                                    Medical Decision Making Amount and/or Complexity of Data Reviewed Labs: ordered. Radiology: ordered.  Risk Prescription drug management. Decision regarding hospitalization.   Nancy [name] is here with bilateral leg swelling, shortness of breath abdominal pain.  Her main concern today is really her leg swelling.  She looks comfortable on exam.  She got mild edema in her legs.  Looks more like dependent edema/venous stasis changes but will evaluate for heart failure, kidney failure or liver failure.  Abdominal exam is benign.  No major tenderness.  She has no obvious distention.  She had clear breath sounds.  No signs of respiratory distress.  Does have history of polysubstance abuse.  Will check CBC CMP lipase troponin BNP chest x-ray urinalysis.  Lab work significant for BNP of 6000.  Troponin normal.  UDS positive for cocaine and amphetamines.  CT scan was negative for blood clot but did show some enlarged pulmonary trunk and may be hypertension.  May be elevated right heart pressure.  Overall may be a indeterminate mass in the liver.  Small volume ascites.  Urinalysis negative for infection.  No significant anemia or electrolyte abnormality otherwise.  She does look volume overloaded on exam.  I have given her a dose of IV Lasix.  I will talk with hospitalist about admission to get further diuresis and echocardiogram.  Hospitalist agrees with admission.  When I talked with patient  further about this she did not want to stay.  Overall she understands that this could be life and death.  She understands that I do think she has new heart failure.  Overall she has capacity make decision to leave AGAINST MEDICAL ADVICE.  Discharge.  Will prescribe Lasix.  Will refer to cardiology.  Made her aware of incidental findings on imaging.  This chart was dictated using voice recognition software.  Despite best efforts to proofread,  errors can occur which can change the documentation meaning.      Final diagnoses:  Hypervolemia, unspecified hypervolemia type  Acute heart failure, unspecified heart failure type [facility])    ED Discharge Orders          Ordered    Ambulatory referral to Cardiology        02/07/24 2256    furosemide (LASIX) 20 MG tablet  Daily        02/07/24 2256               Lowery Rue, DO 02/07/24 2258

## 2024-02-28 ENCOUNTER — Emergency Department (HOSPITAL_BASED_OUTPATIENT_CLINIC_OR_DEPARTMENT_OTHER): Admission: EM | Admit: 2024-02-28 | Discharge: 2024-02-28 | Disposition: A | Discharge status: Home / Self Care

## 2024-02-28 ENCOUNTER — Emergency Department (HOSPITAL_BASED_OUTPATIENT_CLINIC_OR_DEPARTMENT_OTHER)

## 2024-02-28 ENCOUNTER — Encounter (HOSPITAL_BASED_OUTPATIENT_CLINIC_OR_DEPARTMENT_OTHER): Payer: Self-pay

## 2024-02-28 ENCOUNTER — Other Ambulatory Visit: Payer: Self-pay

## 2024-02-28 DIAGNOSIS — Z79899 Other long term (current) drug therapy: Secondary | ICD-10-CM | POA: Diagnosis not present

## 2024-02-28 DIAGNOSIS — R079 Chest pain, unspecified: Secondary | ICD-10-CM | POA: Diagnosis present

## 2024-02-28 DIAGNOSIS — I110 Hypertensive heart disease with heart failure: Secondary | ICD-10-CM | POA: Diagnosis not present

## 2024-02-28 DIAGNOSIS — I509 Heart failure, unspecified: Secondary | ICD-10-CM | POA: Diagnosis not present

## 2024-02-28 DIAGNOSIS — E119 Type 2 diabetes mellitus without complications: Secondary | ICD-10-CM | POA: Insufficient documentation

## 2024-02-28 DIAGNOSIS — R0789 Other chest pain: Secondary | ICD-10-CM | POA: Insufficient documentation

## 2024-02-28 HISTORY — DX: Type 2 diabetes mellitus without complications: E11.9

## 2024-02-28 HISTORY — DX: Essential (primary) hypertension: I10

## 2024-02-28 HISTORY — DX: Heart failure, unspecified: I50.9

## 2024-02-28 LAB — CBC
HCT: 46.6 % — ABNORMAL HIGH (ref 36.0–46.0)
Hemoglobin: 15.5 g/dL — ABNORMAL HIGH (ref 12.0–15.0)
MCH: 29.6 pg (ref 26.0–34.0)
MCHC: 33.3 g/dL (ref 30.0–36.0)
MCV: 89.1 fL (ref 80.0–100.0)
Platelets: 311 K/uL (ref 150–400)
RBC: 5.23 MIL/uL — ABNORMAL HIGH (ref 3.87–5.11)
RDW: 13 % (ref 11.5–15.5)
WBC: 8.9 K/uL (ref 4.0–10.5)
nRBC: 0 % (ref 0.0–0.2)

## 2024-02-28 LAB — BASIC METABOLIC PANEL WITH GFR
Anion gap: 16 — ABNORMAL HIGH (ref 5–15)
BUN: 24 mg/dL — ABNORMAL HIGH (ref 6–20)
CO2: 22 mmol/L (ref 22–32)
Calcium: 8.9 mg/dL (ref 8.9–10.3)
Chloride: 103 mmol/L (ref 98–111)
Creatinine, Ser: 1.19 mg/dL — ABNORMAL HIGH (ref 0.44–1.00)
GFR, Estimated: 57 mL/min — ABNORMAL LOW (ref >60)
Glucose, Bld: 85 mg/dL (ref 70–99)
Potassium: 3.5 mmol/L (ref 3.5–5.1)
Sodium: 141 mmol/L (ref 135–145)

## 2024-02-28 LAB — PRO BRAIN NATRIURETIC PEPTIDE: Pro Brain Natriuretic Peptide: 5296 pg/mL — ABNORMAL HIGH (ref <300.0)

## 2024-02-28 LAB — PREGNANCY, URINE: Preg Test, Ur: NEGATIVE

## 2024-02-28 LAB — D-DIMER, QUANTITATIVE: D-Dimer, Quant: 0.29 ug{FEU}/mL (ref 0.00–0.50)

## 2024-02-28 LAB — TROPONIN T, HIGH SENSITIVITY
Troponin T High Sensitivity: 15 ng/L (ref <19)
Troponin T High Sensitivity: <15 ng/L (ref <19)

## 2024-02-28 MED ORDER — IOHEXOL 350 MG/ML SOLN
100.0000 mL | Freq: Once | INTRAVENOUS | Status: AC | PRN
  Administered 2024-02-28: 100 mL via INTRAVENOUS

## 2024-02-28 NOTE — ED Notes (Signed)
 Ambulated on room air HR  99_, SpO2 100%, RR 18 at start. Ambulated approximately  350 feet, HR 100-110, SpO2 95-100%, RR 20-28 .   Endorses mild increased work of breathing.

## 2024-02-28 NOTE — ED Triage Notes (Signed)
 Pt was at the cardiologist today for a f/u for HF when she had sudden onset of L sided CP described as sometimes sharp and a feeling of palpitations. She has associated ShOB, nausea, and initially had diaphoresis.

## 2024-02-28 NOTE — Discharge Instructions (Signed)
 Today you were seen for chest pain and shortness of breath.  Please follow-up with your cardiologist as soon as possible for further evaluation workup.  You also had an incidental finding of sigmoid diverticulitis, please follow-up with your gastroenterologist for further evaluation and workup for this finding.  Please return to the ED if you have uncontrollable vomiting, increased shortness of breath, or worsening chest pain.  Thank you for letting us  treat you today. After reviewing your labs and imaging, I feel you are safe to go home. Please follow up with your PCP in the next several days and provide them with your records from this visit. Return to the Emergency Room if pain becomes severe or symptoms worsen.

## 2024-02-28 NOTE — ED Provider Notes (Signed)
 [place] [facility] Provider Note   CSN: [id number] Arrival date & time: 02/28/24  1326     Patient presents with: Chest Pain   Nancy [name] is a 48 y.o. female past medical history significant for hypertension, diabetes, and heart failure presents today for sudden onset of left-sided chest pain that is intermittently sharp.  Patient was at the cardiologist for follow-up for her heart failure when her symptoms began.  Patient also notes palpitations, shortness of breath, and nausea.  Patient was initially diaphoretic but this has since resolved.  Patient denies vomiting, fever, cough, abdominal pain, numbness, weakness, vision changes, or tinnitus.    Chest Pain Associated symptoms: nausea, palpitations and shortness of breath        Prior to Admission medications   Medication Sig Start Date End Date Taking? Authorizing Provider  doxycycline  (VIBRAMYCIN ) 100 MG capsule Take 1 capsule (100 mg total) by mouth 2 (two) times daily. 05/22/23   Beverley Leita LABOR, PA-C  EPINEPHrine  0.3 mg/0.3 mL IJ SOAJ injection Inject 0.3 mg into the muscle as needed for anaphylaxis. 05/22/23   Victor Lynwood DASEN, PA-C  furosemide  (LASIX ) 20 MG tablet Take 1 tablet (20 mg total) by mouth daily for 5 days. 02/07/24 02/12/24  Curatolo, Adam, DO  promethazine  (PHENERGAN ) 25 MG tablet Take 1 tablet (25 mg total) by mouth every 6 (six) hours as needed for nausea or vomiting. 06/25/14 03/27/19  Baxter Drivers, MD    Allergies: Patient has no known allergies.    Review of Systems  Respiratory:  Positive for shortness of breath.   Cardiovascular:  Positive for chest pain and palpitations.  Gastrointestinal:  Positive for nausea.    Updated Vital Signs BP (!) 128/107   Pulse 95   Temp 98 F (36.7 C) (Oral)   Resp (!) 25   Ht 5' (1.524 m)   Wt 55.3 kg   SpO2 94%   BMI 23.83 kg/m   Physical Exam Vitals and nursing note reviewed.  Constitutional:      General: She is  not in acute distress.    Appearance: She is well-developed. She is not toxic-appearing.  HENT:     Head: Normocephalic and atraumatic.  Eyes:     Extraocular Movements: Extraocular movements intact.     Conjunctiva/sclera: Conjunctivae normal.  Cardiovascular:     Rate and Rhythm: Regular rhythm. Tachycardia present.     Heart sounds: Normal heart sounds. No murmur heard. Pulmonary:     Effort: Pulmonary effort is normal. No tachypnea or respiratory distress.     Breath sounds: Normal breath sounds.  Abdominal:     Palpations: Abdomen is soft.     Tenderness: There is no abdominal tenderness.  Musculoskeletal:        General: No swelling.     Cervical back: Neck supple.     Right lower leg: No edema.     Left lower leg: No edema.  Skin:    General: Skin is warm and dry.     Capillary Refill: Capillary refill takes less than 2 seconds.  Neurological:     General: No focal deficit present.     Mental Status: She is alert and oriented to person, place, and time.     Motor: No weakness.  Psychiatric:        Mood and Affect: Mood is anxious.     (all labs ordered are listed, but only abnormal results are displayed) Labs Reviewed  BASIC METABOLIC  PANEL WITH GFR - Abnormal; Notable for the following components:      Result Value   BUN 24 (*)    Creatinine, Ser 1.19 (*)    GFR, Estimated 57 (*)    Anion gap 16 (*)    All other components within normal limits  CBC - Abnormal; Notable for the following components:   RBC 5.23 (*)    Hemoglobin 15.5 (*)    HCT 46.6 (*)    All other components within normal limits  PRO BRAIN NATRIURETIC PEPTIDE - Abnormal; Notable for the following components:   Pro Brain Natriuretic Peptide 5,296.0 (*)    All other components within normal limits  PREGNANCY, URINE  D-DIMER, QUANTITATIVE  TROPONIN T, HIGH SENSITIVITY  TROPONIN T, HIGH SENSITIVITY    EKG: EKG Interpretation Date/Time:  Friday February 28 2024 13:40:11 EDT Ventricular Rate:   95 PR Interval:  140 QRS Duration:  96 QT Interval:  362 QTC Calculation: 454 R Axis:   131  Text Interpretation: Normal sinus rhythm Biatrial enlargement Right axis deviation Incomplete right bundle branch block Right ventricular hypertrophy ST & T wave abnormality, consider inferior ischemia ST & T wave abnormality, consider anterolateral ischemia Abnormal ECG When compared with ECG of 07-Feb-2024 18:56, PREVIOUS ECG IS PRESENT Confirmed by Neysa Clap [phone]) on 02/28/2024 3:53:34 PM  Radiology: CT Angio Chest/Abd/Pel for Dissection W and/or Wo Contrast Result Date: 02/28/2024 CLINICAL DATA:  Acute aortic syndrome EXAM: CT ANGIOGRAPHY CHEST, ABDOMEN AND PELVIS TECHNIQUE: Non-contrast CT of the chest was initially obtained. Multidetector CT imaging through the chest, abdomen and pelvis was performed using the standard protocol during bolus administration of intravenous contrast. Multiplanar reconstructed images and MIPs were obtained and reviewed to evaluate the vascular anatomy. RADIATION DOSE REDUCTION: This exam was performed according to the departmental dose-optimization program which includes automated exposure control, adjustment of the mA and/or kV according to patient size and/or use of iterative reconstruction technique. CONTRAST:  OMNIPAQUE  IOHEXOL  350 MG/ML SOLN COMPARISON:  CT chest, abdomen and pelvis 02/07/2024 FINDINGS: CTA CHEST FINDINGS Cardiovascular: Preferential opacification of the thoracic aorta. No evidence of thoracic aortic aneurysm or dissection. No pericardial effusion. The main pulmonary artery is enlarged. The heart is mildly enlarged. Mediastinum/Nodes: No enlarged mediastinal, hilar, or axillary lymph nodes. Thyroid gland, trachea, and esophagus demonstrate no significant findings. Lungs/Pleura: There is a stable ground-glass nodule in the right upper lobe measuring 5 mm image 7/38. There is a stable 2 mm nodule in the left lung base/left lower lobe. The lungs are  otherwise clear. There is no pleural effusion or pneumothorax. No pleural effusion or pneumothorax. Musculoskeletal: No chest wall abnormality. No acute or significant osseous findings. Review of the MIP images confirms the above findings. CTA ABDOMEN AND PELVIS FINDINGS VASCULAR Aorta: He choose 1 there is minimal calcified atherosclerotic disease of the aorta. Celiac: Patent without evidence of aneurysm, dissection, vasculitis or significant stenosis. SMA: Patent without evidence of aneurysm, dissection, vasculitis or significant stenosis. Renals: Both renal arteries are patent without evidence of aneurysm, dissection, vasculitis, fibromuscular dysplasia or significant stenosis. IMA: Patent without evidence of aneurysm, dissection, vasculitis or significant stenosis. Inflow: Patent without evidence of aneurysm, dissection, vasculitis or significant stenosis. Veins: No obvious venous abnormality within the limitations of this arterial phase study. Review of the MIP images confirms the above findings. NON-VASCULAR Hepatobiliary: Previously identified enhancing hepatic mass in the left lobe of the liver is grossly unchanged measuring 3 cm. No new hepatic masses are seen. Pancreas: Unremarkable. No  pancreatic ductal dilatation or surrounding inflammatory changes. Spleen: Normal in size without focal abnormality. Adrenals/Urinary Tract: Adrenal glands are unremarkable. Kidneys are normal, without renal calculi, focal lesion, or hydronephrosis. Bladder is unremarkable. Stomach/Bowel: There is sigmoid colon diverticulosis. There is mild inflammation surrounding a single diverticulum in the proximal sigmoid colon image 11/240. There is no evidence for bowel obstruction, abscess or free air. The appendix is within normal limits. Stomach is within normal limits. Lymphatic: No enlarged lymph nodes. Reproductive: Lobulated uterine contour again seen a fibroid change. Adnexa are unremarkable. Other: No abdominal wall hernia or  abnormality. No abdominopelvic ascites. Musculoskeletal: No acute or significant osseous findings. Review of the MIP images confirms the above findings. IMPRESSION: 1. No evidence for aortic aneurysm or dissection. 2. Mild acute uncomplicated sigmoid colon diverticulitis. 3. Stable enhancing hepatic mass in the left lobe of the liver. 4. Stable pulmonary nodules. Largest nodule is ground-glass measuring 5 mm. No follow-up needed if patient is low-risk (and has no known or suspected primary neoplasm). Non-contrast chest CT can be considered in 12 months if patient is high-risk. This recommendation follows the consensus statement: Guidelines for Management of Incidental Pulmonary Nodules Detected on CT Images: From the Fleischner Society 2017; Radiology 2017; 284:228-243. 5. Fibroid uterus. Electronically Signed   By: Greig Pique M.D.   On: 02/28/2024 17:02   DG Chest Port 1 View Result Date: 02/28/2024 CLINICAL DATA:  [id number] Chest pain [id number] EXAM: PORTABLE CHEST - 1 VIEW COMPARISON:  February 07, 2024 FINDINGS: No focal airspace consolidation, pleural effusion, or pneumothorax. Mild cardiomegaly.No acute fracture or destructive lesion. Multilevel thoracic osteophytosis. IMPRESSION: No acute cardiopulmonary abnormality. Electronically Signed   By: Rogelia Myers M.D.   On: 02/28/2024 14:33     Procedures   Medications Ordered in the ED  iohexol  (OMNIPAQUE ) 350 MG/ML injection 100 mL (100 mLs Intravenous Contrast Given 02/28/24 1605)                                    Medical Decision Making Amount and/or Complexity of Data Reviewed Labs: ordered. Radiology: ordered.  Risk Prescription drug management.   This patient presents to the ED for concern of chest pain, this involves an extensive number of treatment options, and is a complaint that carries with it a high risk of complications and morbidity.  The differential diagnosis includes STEMI, NSTEMI, arrhythmia, electrolyte abnormality, anemia,  anxiety   Co morbidities / Chronic conditions that complicate the patient evaluation  Diabetes, hypertension, heart failure   Additional history obtained:  Additional history obtained from EMR External records from outside source obtained and reviewed including Care Everywhere   Lab Tests:  I Ordered, and personally interpreted labs.  The pertinent results include: Negative pregnancy, elevated hemoglobin at 15.5, elevated BNP at 5296, troponin 15, <15, elevated bun at 24, elevated creatinine 1.19, mild elevated anion gap at 16, d-dimer 0.29   Imaging Studies ordered:  I ordered imaging studies including CXR  I independently visualized and interpreted imaging which showed no acute cardiopulmonary abnormality I agree with the radiologist interpretation CTA chest abdomen pelvis dissection rule out which showed no acute evidence for aortic aneurysm or dissection.  Mild acute uncomplicated sigmoid diverticulitis.  Stable enhancing hepatic mass in the left lobe of the liver.  Stable pulmonary nodules.  Fibroid uterus.   Cardiac Monitoring: / EKG:  The patient was maintained on a cardiac monitor.  I personally viewed and  interpreted the cardiac monitored which showed an underlying rhythm of: Normal sinus rhythm, new RBBB   Test / Admission - Considered:  Patient able to ambulate on room air and maintain pulse ox of 95% or greater.  Patient does endorse some shortness of breath while walking. Considered for admission or further workup however patient's vital signs, physical exam, labs, and imaging been reassuring.  Patient to follow-up with her cardiologist on Tuesday of next week for further evaluation workup.  Patient to follow-up with gastroenterologist regarding incidental finding of sigmoid diverticulitis.  Patient given return precautions.  I feel patient safe for discharge at this time.    Final diagnoses:  Atypical chest pain    ED Discharge Orders     None           Francis Ileana SAILOR, PA-C 02/28/24 1745    Neysa Caron PARAS, DO 03/02/24 1500

## 2024-03-23 ENCOUNTER — Inpatient Hospital Stay (HOSPITAL_BASED_OUTPATIENT_CLINIC_OR_DEPARTMENT_OTHER)
Admission: EM | Admit: 2024-03-23 | Discharge: 2024-03-27 | DRG: 287 | Disposition: A | Discharge status: Home / Self Care | Attending: Internal Medicine | Admitting: Internal Medicine

## 2024-03-23 ENCOUNTER — Emergency Department (HOSPITAL_BASED_OUTPATIENT_CLINIC_OR_DEPARTMENT_OTHER)

## 2024-03-23 ENCOUNTER — Encounter (HOSPITAL_BASED_OUTPATIENT_CLINIC_OR_DEPARTMENT_OTHER): Payer: Self-pay | Admitting: Emergency Medicine

## 2024-03-23 ENCOUNTER — Other Ambulatory Visit: Payer: Self-pay

## 2024-03-23 DIAGNOSIS — I959 Hypotension, unspecified: Secondary | ICD-10-CM | POA: Diagnosis not present

## 2024-03-23 DIAGNOSIS — R000 Tachycardia, unspecified: Secondary | ICD-10-CM | POA: Diagnosis present

## 2024-03-23 DIAGNOSIS — I50813 Acute on chronic right heart failure: Secondary | ICD-10-CM | POA: Diagnosis present

## 2024-03-23 DIAGNOSIS — I4510 Unspecified right bundle-branch block: Secondary | ICD-10-CM | POA: Diagnosis present

## 2024-03-23 DIAGNOSIS — E876 Hypokalemia: Secondary | ICD-10-CM | POA: Diagnosis present

## 2024-03-23 DIAGNOSIS — F1510 Other stimulant abuse, uncomplicated: Secondary | ICD-10-CM | POA: Diagnosis present

## 2024-03-23 DIAGNOSIS — R160 Hepatomegaly, not elsewhere classified: Secondary | ICD-10-CM | POA: Diagnosis present

## 2024-03-23 DIAGNOSIS — Z716 Tobacco abuse counseling: Secondary | ICD-10-CM | POA: Diagnosis not present

## 2024-03-23 DIAGNOSIS — Z79899 Other long term (current) drug therapy: Secondary | ICD-10-CM

## 2024-03-23 DIAGNOSIS — I3139 Other pericardial effusion (noninflammatory): Secondary | ICD-10-CM | POA: Diagnosis present

## 2024-03-23 DIAGNOSIS — I2721 Secondary pulmonary arterial hypertension: Secondary | ICD-10-CM | POA: Diagnosis present

## 2024-03-23 DIAGNOSIS — F1490 Cocaine use, unspecified, uncomplicated: Secondary | ICD-10-CM | POA: Diagnosis present

## 2024-03-23 DIAGNOSIS — Y93A1 Activity, exercise machines primarily for cardiorespiratory conditioning: Secondary | ICD-10-CM

## 2024-03-23 DIAGNOSIS — I509 Heart failure, unspecified: Secondary | ICD-10-CM | POA: Diagnosis present

## 2024-03-23 DIAGNOSIS — R072 Precordial pain: Secondary | ICD-10-CM

## 2024-03-23 DIAGNOSIS — Z5941 Food insecurity: Secondary | ICD-10-CM | POA: Diagnosis not present

## 2024-03-23 DIAGNOSIS — Z720 Tobacco use: Secondary | ICD-10-CM | POA: Diagnosis present

## 2024-03-23 DIAGNOSIS — R55 Syncope and collapse: Secondary | ICD-10-CM | POA: Diagnosis present

## 2024-03-23 DIAGNOSIS — I50811 Acute right heart failure: Secondary | ICD-10-CM | POA: Diagnosis present

## 2024-03-23 DIAGNOSIS — I200 Unstable angina: Secondary | ICD-10-CM | POA: Diagnosis not present

## 2024-03-23 DIAGNOSIS — R002 Palpitations: Secondary | ICD-10-CM | POA: Diagnosis present

## 2024-03-23 DIAGNOSIS — I110 Hypertensive heart disease with heart failure: Secondary | ICD-10-CM | POA: Diagnosis present

## 2024-03-23 DIAGNOSIS — E119 Type 2 diabetes mellitus without complications: Secondary | ICD-10-CM | POA: Diagnosis present

## 2024-03-23 DIAGNOSIS — R7303 Prediabetes: Secondary | ICD-10-CM | POA: Diagnosis present

## 2024-03-23 DIAGNOSIS — F17210 Nicotine dependence, cigarettes, uncomplicated: Secondary | ICD-10-CM | POA: Diagnosis present

## 2024-03-23 DIAGNOSIS — K769 Liver disease, unspecified: Secondary | ICD-10-CM | POA: Diagnosis present

## 2024-03-23 DIAGNOSIS — M349 Systemic sclerosis, unspecified: Secondary | ICD-10-CM | POA: Diagnosis present

## 2024-03-23 DIAGNOSIS — I10 Essential (primary) hypertension: Secondary | ICD-10-CM | POA: Diagnosis not present

## 2024-03-23 DIAGNOSIS — R079 Chest pain, unspecified: Secondary | ICD-10-CM | POA: Diagnosis not present

## 2024-03-23 LAB — CBC WITH DIFFERENTIAL/PLATELET
Abs Immature Granulocytes: 0.04 K/uL (ref 0.00–0.07)
Basophils Absolute: 0.1 K/uL (ref 0.0–0.1)
Basophils Relative: 0 %
Eosinophils Absolute: 0.1 K/uL (ref 0.0–0.5)
Eosinophils Relative: 1 %
HCT: 47.8 % — ABNORMAL HIGH (ref 36.0–46.0)
Hemoglobin: 16 g/dL — ABNORMAL HIGH (ref 12.0–15.0)
Immature Granulocytes: 0 %
Lymphocytes Relative: 17 %
Lymphs Abs: 1.9 K/uL (ref 0.7–4.0)
MCH: 30.2 pg (ref 26.0–34.0)
MCHC: 33.5 g/dL (ref 30.0–36.0)
MCV: 90.2 fL (ref 80.0–100.0)
Monocytes Absolute: 0.9 K/uL (ref 0.1–1.0)
Monocytes Relative: 8 %
Neutro Abs: 8.2 K/uL — ABNORMAL HIGH (ref 1.7–7.7)
Neutrophils Relative %: 74 %
Platelets: 229 K/uL (ref 150–400)
RBC: 5.3 MIL/uL — ABNORMAL HIGH (ref 3.87–5.11)
RDW: 13.7 % (ref 11.5–15.5)
WBC: 11.2 K/uL — ABNORMAL HIGH (ref 4.0–10.5)
nRBC: 0 % (ref 0.0–0.2)

## 2024-03-23 LAB — COMPREHENSIVE METABOLIC PANEL WITH GFR
ALT: 62 U/L — ABNORMAL HIGH (ref 0–44)
AST: 49 U/L — ABNORMAL HIGH (ref 15–41)
Albumin: 4.2 g/dL (ref 3.5–5.0)
Alkaline Phosphatase: 85 U/L (ref 38–126)
Anion gap: 13 (ref 5–15)
BUN: 18 mg/dL (ref 6–20)
CO2: 26 mmol/L (ref 22–32)
Calcium: 9.4 mg/dL (ref 8.9–10.3)
Chloride: 102 mmol/L (ref 98–111)
Creatinine, Ser: 1.21 mg/dL — ABNORMAL HIGH (ref 0.44–1.00)
GFR, Estimated: 56 mL/min — ABNORMAL LOW (ref >60)
Glucose, Bld: 81 mg/dL (ref 70–99)
Potassium: 3.8 mmol/L (ref 3.5–5.1)
Sodium: 141 mmol/L (ref 135–145)
Total Bilirubin: 0.5 mg/dL (ref 0.0–1.2)
Total Protein: 6.7 g/dL (ref 6.5–8.1)

## 2024-03-23 LAB — TROPONIN T, HIGH SENSITIVITY
Troponin T High Sensitivity: 16 ng/L (ref <19)
Troponin T High Sensitivity: <15 ng/L (ref <19)

## 2024-03-23 LAB — PRO BRAIN NATRIURETIC PEPTIDE: Pro Brain Natriuretic Peptide: 6257 pg/mL — ABNORMAL HIGH (ref <300.0)

## 2024-03-23 LAB — HCG, SERUM, QUALITATIVE: Preg, Serum: NEGATIVE

## 2024-03-23 LAB — D-DIMER, QUANTITATIVE: D-Dimer, Quant: 0.3 ug{FEU}/mL (ref 0.00–0.50)

## 2024-03-23 MED ORDER — ASPIRIN 81 MG PO CHEW
81.0000 mg | CHEWABLE_TABLET | Freq: Every day | ORAL | Status: DC
  Administered 2024-03-23 – 2024-03-27 (×4): 81 mg via ORAL
  Filled 2024-03-23 (×4): qty 1

## 2024-03-23 MED ORDER — SODIUM CHLORIDE 0.9% FLUSH
3.0000 mL | Freq: Two times a day (BID) | INTRAVENOUS | Status: DC
  Administered 2024-03-24 – 2024-03-27 (×7): 3 mL via INTRAVENOUS

## 2024-03-23 MED ORDER — ENOXAPARIN SODIUM 40 MG/0.4ML IJ SOSY
40.0000 mg | PREFILLED_SYRINGE | INTRAMUSCULAR | Status: DC
  Administered 2024-03-24: 40 mg via SUBCUTANEOUS
  Filled 2024-03-23: qty 0.4

## 2024-03-23 MED ORDER — FUROSEMIDE 10 MG/ML IJ SOLN
60.0000 mg | Freq: Once | INTRAMUSCULAR | Status: DC
  Filled 2024-03-23: qty 6

## 2024-03-23 MED ORDER — ACETAMINOPHEN 325 MG PO TABS: 650.0000 mg | ORAL_TABLET | Freq: Four times a day (QID) | ORAL | Status: DC | PRN

## 2024-03-23 MED ORDER — FUROSEMIDE 10 MG/ML IJ SOLN
40.0000 mg | Freq: Once | INTRAMUSCULAR | Status: AC
  Administered 2024-03-23: 40 mg via INTRAVENOUS

## 2024-03-23 MED ORDER — SENNOSIDES-DOCUSATE SODIUM 8.6-50 MG PO TABS: 1.0000 | ORAL_TABLET | Freq: Every evening | ORAL | Status: DC | PRN

## 2024-03-23 MED ORDER — ACETAMINOPHEN 650 MG RE SUPP: 650.0000 mg | Freq: Four times a day (QID) | RECTAL | Status: DC | PRN

## 2024-03-23 MED ORDER — ONDANSETRON HCL 4 MG PO TABS: 4.0000 mg | ORAL_TABLET | Freq: Four times a day (QID) | ORAL | Status: DC | PRN

## 2024-03-23 MED ORDER — FUROSEMIDE 40 MG PO TABS
40.0000 mg | ORAL_TABLET | Freq: Every day | ORAL | Status: DC
  Administered 2024-03-24: 40 mg via ORAL
  Filled 2024-03-23: qty 1

## 2024-03-23 MED ORDER — CARVEDILOL 3.125 MG PO TABS: 3.1250 mg | ORAL_TABLET | Freq: Two times a day (BID) | ORAL | Status: DC

## 2024-03-23 MED ORDER — LOSARTAN POTASSIUM 50 MG PO TABS
50.0000 mg | ORAL_TABLET | Freq: Every day | ORAL | Status: DC
  Administered 2024-03-24 – 2024-03-25 (×2): 50 mg via ORAL
  Filled 2024-03-23 (×2): qty 1

## 2024-03-23 MED ORDER — ONDANSETRON HCL 4 MG/2ML IJ SOLN
4.0000 mg | Freq: Four times a day (QID) | INTRAMUSCULAR | Status: DC | PRN
Start: 2024-03-23 — End: 2024-03-24
  Filled 2024-03-23: qty 2

## 2024-03-23 NOTE — Plan of Care (Signed)
 Nancy [name], is a 48 y.o. female, DOB - [birth year], FMW:[id number]  Relatively healthy, following with [facility] for a new diagnosis of CHF, had a stress test done today - syncope during the test, sent to [facility] ER, pain free, ++ high BNP, CXR >> CHF, lasix  given, symptom free at rest, Trop and EKG negative, admit to Cards for Echo, Lasix  and cards consultation ? Cath.   Vitals:   03/23/24 1247 03/23/24 1315 03/23/24 1316 03/23/24 1330  BP:  100/82  102/82  Pulse:  (!) 103  (!) 102  Resp:  19  18  Temp:      SpO2:  100% 100% 95%  Weight: 55.3 kg     Height: 5' (1.524 m)           Data Review   Micro Results No results found for this or any previous visit (from the past 240 hours).  Radiology Reports DG Chest 2 View Result Date: 03/23/2024 EXAM: 2 VIEW(S) XRAY OF THE CHEST 03/23/2024 01:52:00 PM COMPARISON: 02/28/2024 CLINICAL HISTORY: CP. PT was doing her stress test for her heart and became syncopal. Pt was seen here for Chest pain in July. HX: Cardiology patient, HTN, Heart failure, diabetes. FINDINGS: LUNGS AND PLEURA: No focal pulmonary opacity. No pulmonary edema. No pleural effusion. No pneumothorax. HEART AND MEDIASTINUM: Mild cardiomegaly. BONES AND SOFT TISSUES: No acute osseous abnormality. IMPRESSION: 1. Mild cardiomegaly, without congestive failure . Electronically signed by: Rockey Kilts MD 03/23/2024 02:02 PM EDT RP Workstation: [id number]   CT Angio Chest/Abd/Pel for Dissection W and/or Wo Contrast Result Date: 02/28/2024 CLINICAL DATA:  Acute aortic syndrome EXAM: CT ANGIOGRAPHY CHEST, ABDOMEN AND PELVIS TECHNIQUE: Non-contrast CT of the chest was initially obtained. Multidetector CT imaging through the chest, abdomen and pelvis was performed using the standard protocol during bolus administration of intravenous contrast. Multiplanar reconstructed images and MIPs were obtained and  reviewed to evaluate the vascular anatomy. RADIATION DOSE REDUCTION: This exam was performed according to the departmental dose-optimization program which includes automated exposure control, adjustment of the mA and/or kV according to patient size and/or use of iterative reconstruction technique. CONTRAST:  OMNIPAQUE  IOHEXOL  350 MG/ML SOLN COMPARISON:  CT chest, abdomen and pelvis 02/07/2024 FINDINGS: CTA CHEST FINDINGS Cardiovascular: Preferential opacification of the thoracic aorta. No evidence of thoracic aortic aneurysm or dissection. No pericardial effusion. The main pulmonary artery is enlarged. The heart is mildly enlarged. Mediastinum/Nodes: No enlarged mediastinal, hilar, or axillary lymph nodes. Thyroid gland, trachea, and esophagus demonstrate no significant findings. Lungs/Pleura: There is a stable ground-glass nodule in the right upper lobe measuring 5 mm image 7/38. There is a stable 2 mm nodule in the left lung base/left lower lobe. The lungs are otherwise clear. There is no pleural effusion or pneumothorax. No pleural effusion or pneumothorax. Musculoskeletal: No chest wall abnormality. No acute or significant osseous findings. Review of the MIP images confirms the above findings. CTA ABDOMEN AND PELVIS FINDINGS VASCULAR Aorta: He choose 1 there is minimal calcified atherosclerotic disease of the aorta. Celiac: Patent without evidence of aneurysm, dissection, vasculitis or significant stenosis. SMA: Patent without evidence of aneurysm, dissection, vasculitis or significant stenosis. Renals: Both renal arteries are patent without evidence of aneurysm, dissection, vasculitis, fibromuscular dysplasia or significant stenosis. IMA: Patent without evidence of aneurysm, dissection, vasculitis or significant stenosis. Inflow: Patent without evidence of aneurysm, dissection, vasculitis or significant stenosis. Veins: No obvious venous abnormality within the limitations of this arterial phase study.  Review of the MIP images  confirms the above findings. NON-VASCULAR Hepatobiliary: Previously identified enhancing hepatic mass in the left lobe of the liver is grossly unchanged measuring 3 cm. No new hepatic masses are seen. Pancreas: Unremarkable. No pancreatic ductal dilatation or surrounding inflammatory changes. Spleen: Normal in size without focal abnormality. Adrenals/Urinary Tract: Adrenal glands are unremarkable. Kidneys are normal, without renal calculi, focal lesion, or hydronephrosis. Bladder is unremarkable. Stomach/Bowel: There is sigmoid colon diverticulosis. There is mild inflammation surrounding a single diverticulum in the proximal sigmoid colon image 11/240. There is no evidence for bowel obstruction, abscess or free air. The appendix is within normal limits. Stomach is within normal limits. Lymphatic: No enlarged lymph nodes. Reproductive: Lobulated uterine contour again seen a fibroid change. Adnexa are unremarkable. Other: No abdominal wall hernia or abnormality. No abdominopelvic ascites. Musculoskeletal: No acute or significant osseous findings. Review of the MIP images confirms the above findings. IMPRESSION: 1. No evidence for aortic aneurysm or dissection. 2. Mild acute uncomplicated sigmoid colon diverticulitis. 3. Stable enhancing hepatic mass in the left lobe of the liver. 4. Stable pulmonary nodules. Largest nodule is ground-glass measuring 5 mm. No follow-up needed if patient is low-risk (and has no known or suspected primary neoplasm). Non-contrast chest CT can be considered in 12 months if patient is high-risk. This recommendation follows the consensus statement: Guidelines for Management of Incidental Pulmonary Nodules Detected on CT Images: From the Fleischner Society 2017; Radiology 2017; 284:228-243. 5. Fibroid uterus. Electronically Signed   By: Greig Pique M.D.   On: 02/28/2024 17:02   DG Chest Port 1 View Result Date: 02/28/2024 CLINICAL DATA:  [id number] Chest pain [id number]  EXAM: PORTABLE CHEST - 1 VIEW COMPARISON:  February 07, 2024 FINDINGS: No focal airspace consolidation, pleural effusion, or pneumothorax. Mild cardiomegaly.No acute fracture or destructive lesion. Multilevel thoracic osteophytosis. IMPRESSION: No acute cardiopulmonary abnormality. Electronically Signed   By: Rogelia Myers M.D.   On: 02/28/2024 14:33    CBC Recent Labs  Lab 03/23/24 1322  WBC 11.2*  HGB 16.0*  HCT 47.8*  PLT 229  MCV 90.2  MCH 30.2  MCHC 33.5  RDW 13.7  LYMPHSABS 1.9  MONOABS 0.9  EOSABS 0.1  BASOSABS 0.1    Chemistries  Recent Labs  Lab 03/23/24 1322  NA 141  K 3.8  CL 102  CO2 26  GLUCOSE 81  BUN 18  CREATININE 1.21*  CALCIUM  9.4  AST 49*  ALT 62*  ALKPHOS 85  BILITOT 0.5   ------------------------------------------------------------------------------------------------------------------ estimated creatinine clearance is 45.3 mL/min (A) (by C-G formula based on SCr of 1.21 mg/dL (H)). ------------------------------------------------------------------------------------------------------------------ No results for input(s): HGBA1C in the last 72 hours. ------------------------------------------------------------------------------------------------------------------ No results for input(s): CHOL, HDL, LDLCALC, TRIG, CHOLHDL, LDLDIRECT in the last 72 hours. ------------------------------------------------------------------------------------------------------------------ No results for input(s): TSH, T4TOTAL, T3FREE, THYROIDAB in the last 72 hours.  Invalid input(s): FREET3 ------------------------------------------------------------------------------------------------------------------ No results for input(s): VITAMINB12, FOLATE, FERRITIN, TIBC, IRON, RETICCTPCT in the last 72 hours.  Coagulation profile No results for input(s): INR, PROTIME in the last 168 hours.  Recent Labs    03/23/24 1342  DDIMER 0.30     Cardiac Enzymes No results for input(s): CKMB, TROPONINI, MYOGLOBIN in the last 168 hours.  Invalid input(s): CK ------------------------------------------------------------------------------------------------------------------ Invalid input(s): POCBNP   Signature  Lavada Stank M.D on 03/23/2024 at 3:09 PM   -  To page go to [email or web address]

## 2024-03-23 NOTE — ED Triage Notes (Signed)
 Was doing her stress test for her heart and became syncopal  was seen here for cp in July

## 2024-03-23 NOTE — ED Provider Notes (Signed)
 Emergency Department Provider Note   I have reviewed the triage vital signs and the nursing notes.   HISTORY  Chief Complaint Chest Pain   HPI Nancy [name] is a 48 y.o. female past history of diabetes, hypertension, congestive heart failure followed at [facility] with unknown ejection fraction presents to the emergency department with near syncope and chest pain during her stress test today.  She was at the [facility] undergoing a stress test.  She reports being newly diagnosed with congestive heart failure.  She has been compliant with her diuretic and has newly been on blood pressure medication.  No fevers.  She was on the treadmill today and during the test began experiencing worsening left-sided chest discomfort and began to see spots like she was going to pass out.  She was assisted off the machine and presented here for evaluation.  No known history of ACS.  The etiology of her heart failure is unknown to the patient.  She reports having an echo done at [place] but unsure of the test results.  She is calling to try and have them faxed to our facility.  Past Medical History:  Diagnosis Date   Diabetes mellitus without complication (HCC)    prediabetes   Heart failure (HCC)    Hypertension     Review of Systems  Constitutional: No fever/chills Cardiovascular: Positive chest pain and syncope.  Respiratory: Denies shortness of breath. Gastrointestinal: No abdominal pain.  No nausea, no vomiting.   Neurological: Negative for headaches.  ____________________________________________   PHYSICAL EXAM:  VITAL SIGNS: ED Triage Vitals  Encounter Vitals Group     BP 03/23/24 1245 102/87     Pulse Rate 03/23/24 1245 (!) 108     Resp 03/23/24 1245 18     Temp 03/23/24 1245 97.8 F (36.6 C)     Temp src --      SpO2 03/23/24 1245 99 %     Weight 03/23/24 1247 121 lb 14.6 oz (55.3 kg)     Height 03/23/24 1247 5' (1.524 m)   Constitutional: Alert and oriented.  Well appearing and in no acute distress. Eyes: Conjunctivae are normal.  Head: Atraumatic. Nose: No congestion/rhinnorhea. Mouth/Throat: Mucous membranes are moist.  Neck: No stridor.  Cardiovascular: Normal rate, regular rhythm. Good peripheral circulation. Grossly normal heart sounds.   Respiratory: Normal respiratory effort.  No retractions. Lungs CTAB. Gastrointestinal: Soft and nontender. No distention.  Musculoskeletal: No lower extremity tenderness nor edema. No gross deformities of extremities. Neurologic:  Normal speech and language. No gross focal neurologic deficits are appreciated.  Skin:  Skin is warm, dry and intact. No rash noted.  ____________________________________________   LABS (all labs ordered are listed, but only abnormal results are displayed)  Labs Reviewed  COMPREHENSIVE METABOLIC PANEL WITH GFR - Abnormal; Notable for the following components:      Result Value   Creatinine, Ser 1.21 (*)    AST 49 (*)    ALT 62 (*)    GFR, Estimated 56 (*)    All other components within normal limits  PRO BRAIN NATRIURETIC PEPTIDE - Abnormal; Notable for the following components:   Pro Brain Natriuretic Peptide 6,257.0 (*)    All other components within normal limits  CBC WITH DIFFERENTIAL/PLATELET - Abnormal; Notable for the following components:   WBC 11.2 (*)    RBC 5.30 (*)    Hemoglobin 16.0 (*)    HCT 47.8 (*)    Neutro Abs 8.2 (*)  All other components within normal limits  CBC - Abnormal; Notable for the following components:   RBC 5.18 (*)    Hemoglobin 15.6 (*)    HCT 46.9 (*)    All other components within normal limits  COMPREHENSIVE METABOLIC PANEL WITH GFR - Abnormal; Notable for the following components:   Glucose, Bld 105 (*)    Creatinine, Ser 1.05 (*)    Total Protein 5.9 (*)    Albumin  3.1 (*)    All other components within normal limits  HCG, SERUM, QUALITATIVE  D-DIMER, QUANTITATIVE  MAGNESIUM  HIV ANTIBODY (ROUTINE TESTING W REFLEX)   TROPONIN T, HIGH SENSITIVITY  TROPONIN T, HIGH SENSITIVITY   ____________________________________________  EKG   EKG Interpretation Date/Time:  Monday March 23 2024 12:49:50 EDT Ventricular Rate:  107 PR Interval:  137 QRS Duration:  96 QT Interval:  348 QTC Calculation: 465 R Axis:   130  Text Interpretation: Sinus tachycardia Biatrial enlargement Low voltage, precordial leads RVH with secondary repolarization abnrm Nonspecific T abnormalities, lateral leads Similar to July 11th tracing Confirmed by Darra Chew [phone]) on 03/23/2024 12:55:26 PM        ____________________________________________  RADIOLOGY  DG Chest 2 View Result Date: 03/23/2024 EXAM: 2 VIEW(S) XRAY OF THE CHEST 03/23/2024 01:52:00 PM COMPARISON: 02/28/2024 CLINICAL HISTORY: CP. PT was doing her stress test for her heart and became syncopal. Pt was seen here for Chest pain in July. HX: Cardiology patient, HTN, Heart failure, diabetes. FINDINGS: LUNGS AND PLEURA: No focal pulmonary opacity. No pulmonary edema. No pleural effusion. No pneumothorax. HEART AND MEDIASTINUM: Mild cardiomegaly. BONES AND SOFT TISSUES: No acute osseous abnormality. IMPRESSION: 1. Mild cardiomegaly, without congestive failure . Electronically signed by: Rockey Kilts MD 03/23/2024 02:02 PM EDT RP Workstation: [id number]    ____________________________________________   PROCEDURES  Procedure(s) performed:   Procedures  None ____________________________________________   INITIAL IMPRESSION / ASSESSMENT AND PLAN / ED COURSE  Pertinent labs & imaging results that were available during my care of the patient were reviewed by me and considered in my medical decision making (see chart for details).   This patient is Presenting for Evaluation of CP/near syncope, which does require a range of treatment options, and is a complaint that involves a high risk of morbidity and mortality.  The Differential Diagnoses includes but is not  exclusive to acute coronary syndrome, aortic dissection, pulmonary embolism, cardiac tamponade, community-acquired pneumonia, pericarditis, musculoskeletal chest wall pain, etc.   Critical Interventions-    Medications  aspirin  chewable tablet 81 mg (81 mg Oral Given 03/23/24 1532)  enoxaparin  (LOVENOX ) injection 40 mg (has no administration in time range)  sodium chloride  flush (NS) 0.9 % injection 3 mL (has no administration in time range)  acetaminophen  (TYLENOL ) tablet 650 mg (has no administration in time range)    Or  acetaminophen  (TYLENOL ) suppository 650 mg (has no administration in time range)  ondansetron  (ZOFRAN ) tablet 4 mg (has no administration in time range)    Or  ondansetron  (ZOFRAN ) injection 4 mg (has no administration in time range)  senna-docusate (Senokot-S) tablet 1 tablet (has no administration in time range)  furosemide  (LASIX ) tablet 40 mg (has no administration in time range)  losartan  (COZAAR ) tablet 50 mg (has no administration in time range)  furosemide  (LASIX ) injection 40 mg (40 mg Intravenous Given 03/23/24 1458)    Reassessment after intervention:  symptoms unchanged.    I did obtain Additional Historical Information from family at bedside.  I decided to review pertinent  External Data, and in summary unable to view most recent ECHO from patient's phone.   Clinical Laboratory Tests Ordered, included d dimer and troponin negative. BNP > 6000.   Radiologic Tests Ordered, included CXR. I independently interpreted the images and agree with radiology interpretation.   Cardiac Monitor Tracing which shows sinus tachycardia.    Social Determinants of Health Risk patient is a smoker.   Consult complete with TRH. Plan for admit.   Medical Decision Making: Summary:  The patient presents to the emergency department with near syncope during a stress test at [facility].  She is feeling well at rest here in the ED with mild tachycardia.  No severe  hypotension or acute distress.  I do not know the type or severity of her congestive heart failure.  EKG from [place] (paper at bedside) is similar to ours here.  She has recently had CT PE scans as well as CT dissection scans but no echo in our system.  Plan for labs, x-ray, likely admit given history.   Reevaluation with update and discussion with patient. Plan for admit. She is in agreement.   Patient's presentation is most consistent with acute presentation with potential threat to life or bodily function.   Disposition: discharge  ____________________________________________  FINAL CLINICAL IMPRESSION(S) / ED DIAGNOSES  Final diagnoses:  Syncope, unspecified syncope type  Precordial chest pain     Note:  This document was prepared using Dragon voice recognition software and may include unintentional dictation errors.  Fonda Law, MD, [facility]    Graylyn Bunney, Fonda MATSU, MD 03/24/24 [phone]

## 2024-03-23 NOTE — Hospital Course (Signed)
 Mrs. Nancy [name] was admitted to the hospital with the working diagnosis of near syncope.   48 y.o. female with medical history significant for heart failure, hypertension, liver mass, tobacco who presented after having a near syncope during a exercise stress test. Patient was undergoing treadmill exercise testing for chest pain work up. While running she developed dizziness and light headedness, that prompted ending the test and she was referred to the ED. No loss of consciousness.  On her initial physical examination her blood pressure was 102/87, HR 108, RR 18 and 02 saturation 99% on room air,  Lungs with no wheezing or rhonchi, heart with S1 and S2 present and regular, abdomen with no distention and no lower extremity edema.   Na 141, K 3.8 Cl 102 bicarbonate 26, glucose 81, bun 18 cr 1,21 AST 49 and ALT 62  BNP 6,257  High sensitive troponin 16 and < 15  Wbc 11,2 hgb 16,0 plt 229  D dimer 0,30   01/2024 toxicology screen positive for amphetamines and cocaine.   Chest radiograph with mild cardiomegaly with no infiltrates or effusions.   01/2024 CT angiography, negative for acute pulmonary embolus or aortic dissection.  Enlarged pulmonary trunk.  Heterogenous liver enhancement, suspect due to passive congestion. Indeterminate 2,9 cm enhancing liver mass. (Recommended outpatient abdominal MRI).  Prominent endometrial thickening measuring up to 2,8 cm, recommended follow up with pelvic ultrasound.   EKG 107 bpm, right axis deviation, qtc 465, qrs not prolonged but with right bundle branch block morphology, sinus rhythm with right atrial enlargement, no significant ST segment changes, negative T wave II, III, aVF, V4 to V6.   Echocardiogram with severe RV systolic failure.  Patient was placed on digoxin , midodrine  and tadalafil .  08/06 right heart catheterization with moderate to severe pulmonary arterial hypertension  Will need close follow up as outpatient.

## 2024-03-23 NOTE — ED Notes (Signed)
 Called [facility] for transport @19 :04.  Spoke with Luke

## 2024-03-23 NOTE — H&P (Signed)
 History and Physical    Nancy [name] FMW:[id number] DOB: [birth year] DOA: 03/23/2024  PCP: Melba Lamarr BRAVO, NP  Patient coming from: Home  I have personally briefly reviewed patient's old medical records in [facility]  Chief Complaint: Near syncope  HPI: Nancy [name] is a 48 y.o. female with medical history significant for recently diagnosed CHF (unknown EF), HTN, liver mass, tobacco use who presented to the ED for evaluation after a near syncopal episode during a cardiac stress test.  Patient states that she has been dealing with exertional chest pain for several months now.  She says she was recently diagnosed with congestive heart failure and was told that her right side of her heart was larger than the left.    She was undergoing exercise stress test earlier today when while she was running on the treadmill she developed any chest pain and became lightheaded and dizzy.  She felt as if she was going to pass out but she did not.  She did not fall.  She had broken out in sweats and some nausea.  She was sent to the ED for further evaluation.  Patient states he takes Lasix  40 mg daily and losartan  50 mg daily.  She reports good urine output.  She has not seen any lower extremity swelling.  ED Course  Labs/Imaging on admission: I have personally reviewed following labs and imaging studies.  Initial vitals showed BP 102/87, pulse 108, RR 18, temp 97.8 F, SpO2 99% on room air.  Labs showed troponin T 16 and <15 on repeat.  D-dimer 0.30, proBNP 6257.  WBC 11.2, hemoglobin 16.0, platelets 229, sodium 141, potassium 3.8, bicarb 26, BUN 18, creatinine 1.21, serum glucose 81, AST 49, ALT 62, alk phos 85, total bilirubin 0.5.  Serum pregnancy negative.  2 view chest x-ray showed mild cardiomegaly.  No focal consolidation, edema, effusion.  Patient was given IV Lasix  40 mg, aspirin  81 mg.  The hospitalist service was consulted to admit.  Review of Systems: All systems reviewed  and are negative except as documented in history of present illness above.   Past Medical History:  Diagnosis Date   Diabetes mellitus without complication (HCC)    prediabetes   Heart failure (HCC)    Hypertension     Past Surgical History:  Procedure Laterality Date   CESAREAN SECTION      Social History: Patient reports smoking 0.5 PPD.  He denies illicit drug use.  No Known Allergies  History reviewed. No pertinent family history.   Prior to Admission medications   Medication Sig Start Date End Date Taking? Authorizing Provider  doxycycline  (VIBRAMYCIN ) 100 MG capsule Take 1 capsule (100 mg total) by mouth 2 (two) times daily. 05/22/23   Beverley Leita LABOR, PA-C  EPINEPHrine  0.3 mg/0.3 mL IJ SOAJ injection Inject 0.3 mg into the muscle as needed for anaphylaxis. 05/22/23   Victor Lynwood DASEN, PA-C  furosemide  (LASIX ) 20 MG tablet Take 1 tablet (20 mg total) by mouth daily for 5 days. 02/07/24 02/12/24  Curatolo, Adam, DO  promethazine  (PHENERGAN ) 25 MG tablet Take 1 tablet (25 mg total) by mouth every 6 (six) hours as needed for nausea or vomiting. 06/25/14 03/27/19  Baxter Drivers, MD    Physical Exam: Vitals:   03/23/24 1945 03/23/24 2000 03/23/24 2110 03/23/24 2114  BP: 94/72 117/88  112/82  Pulse: (!) 103 (!) 101  92  Resp: 16 16  18   Temp:    97.6 F (36.4 C)  TempSrc:    Oral  SpO2: 99% 97%  100%  Weight:   56.1 kg   Height:   5' (1.524 m)    Constitutional: Resting in bed, NAD, calm, comfortable Eyes: EOMI, lids and conjunctivae normal ENMT: Mucous membranes are moist. Posterior pharynx clear of any exudate or lesions.Normal dentition.  Neck: normal, supple, no masses. Respiratory: clear to auscultation bilaterally, no wheezing, no crackles. Normal respiratory effort. No accessory muscle use.  Cardiovascular: Regular rate and rhythm, no murmurs / rubs / gallops. No extremity edema. 2+ pedal pulses. Abdomen: no tenderness, no masses palpated. Musculoskeletal: no  clubbing / cyanosis. No joint deformity upper and lower extremities. Good ROM, no contractures. Normal muscle tone.  Skin: no rashes, lesions, ulcers. No induration Neurologic: Sensation intact. Strength 5/5 in all 4.  Psychiatric: Normal judgment and insight. Alert and oriented x 3. Normal mood.   EKG: Personally reviewed. Sinus tachycardia, rate 107, biatrial enlargement, low voltage, RVH, nonspecific ST changes throughout.  Similar to previous.  Assessment/Plan Principal Problem:   Congestive heart failure with unknown left ventricular ejection fraction (HCC) Active Problems:   Near syncope   Essential hypertension   Liver mass   Tobacco use   Nancy [name] is a 48 y.o. female with medical history significant for recently diagnosed CHF (unknown EF), HTN, liver mass, tobacco use who is admitted for evaluation of a near syncopal episode occurring while undergoing exercise stress test.  Assessment and Plan: Near syncope: Occurred while undergoing exercise stress testing.  She had associated chest pain.  Troponin negative x 2.  EKG shows chronic nonspecific ST changes. - Keep on telemetry - Obtain echocardiogram  Congestive heart failure, unspecified EF: Patient reports recent CHF diagnosis.  Given IV Lasix  in the ED.  She appears euvolemic. - Resume home Lasix  40 mg daily - Continue losartan  50 mg daily - Obtain echocardiogram  Hypertension: Continue losartan .  Known liver mass: Followed by [facility].  MRI concerning for hepatocellular carcinoma.  Liver biopsy was performed on 03/20/2024, results pending.  Tobacco use: Patient reports smoking 0.5 PPD.  Smoking cessation advised.  She declines nicotine  patch.    DVT prophylaxis: enoxaparin  (LOVENOX ) injection 40 mg Start: 03/24/24 1000 Code Status:   Code Status: Full Code  Family Communication: Discussed with patient, she has discussed with family Disposition Plan: From home and likely discharge to home pending  clinical progress Consults called: None Severity of Illness: The appropriate patient status for this patient is INPATIENT. Inpatient status is judged to be reasonable and necessary in order to provide the required intensity of service to ensure the patient's safety. The patient's presenting symptoms, physical exam findings, and initial radiographic and laboratory data in the context of their chronic comorbidities is felt to place them at high risk for further clinical deterioration. Furthermore, it is not anticipated that the patient will be medically stable for discharge from the hospital within 2 midnights of admission.   * I certify that at the point of admission it is my clinical judgment that the patient will require inpatient hospital care spanning beyond 2 midnights from the point of admission due to high intensity of service, high risk for further deterioration and high frequency of surveillance required.DEWAINE Jorie Blanch MD [facility]  If 7PM-7AM, please contact night-coverage [email or web address]  03/23/2024, 11:39 PM

## 2024-03-24 ENCOUNTER — Inpatient Hospital Stay (HOSPITAL_COMMUNITY)

## 2024-03-24 DIAGNOSIS — R079 Chest pain, unspecified: Secondary | ICD-10-CM

## 2024-03-24 DIAGNOSIS — I50811 Acute right heart failure: Secondary | ICD-10-CM

## 2024-03-24 DIAGNOSIS — I10 Essential (primary) hypertension: Secondary | ICD-10-CM | POA: Diagnosis not present

## 2024-03-24 DIAGNOSIS — I509 Heart failure, unspecified: Secondary | ICD-10-CM | POA: Diagnosis not present

## 2024-03-24 LAB — ECHOCARDIOGRAM COMPLETE
AR max vel: 2.48 cm2
AV Area VTI: 2.53 cm2
AV Area mean vel: 2.33 cm2
AV Mean grad: 2 mmHg
AV Peak grad: 3.1 mmHg
Ao pk vel: 0.88 m/s
Height: 60 in
S' Lateral: 1.8 cm
Weight: 1978.85 [oz_av]

## 2024-03-24 LAB — CBC
HCT: 46.9 % — ABNORMAL HIGH (ref 36.0–46.0)
Hemoglobin: 15.6 g/dL — ABNORMAL HIGH (ref 12.0–15.0)
MCH: 30.1 pg (ref 26.0–34.0)
MCHC: 33.3 g/dL (ref 30.0–36.0)
MCV: 90.5 fL (ref 80.0–100.0)
Platelets: 227 K/uL (ref 150–400)
RBC: 5.18 MIL/uL — ABNORMAL HIGH (ref 3.87–5.11)
RDW: 13.6 % (ref 11.5–15.5)
WBC: 9.6 K/uL (ref 4.0–10.5)
nRBC: 0 % (ref 0.0–0.2)

## 2024-03-24 LAB — MAGNESIUM: Magnesium: 2.2 mg/dL (ref 1.7–2.4)

## 2024-03-24 LAB — COMPREHENSIVE METABOLIC PANEL WITH GFR
ALT: 44 U/L (ref 0–44)
AST: 33 U/L (ref 15–41)
Albumin: 3.1 g/dL — ABNORMAL LOW (ref 3.5–5.0)
Alkaline Phosphatase: 64 U/L (ref 38–126)
Anion gap: 10 (ref 5–15)
BUN: 16 mg/dL (ref 6–20)
CO2: 26 mmol/L (ref 22–32)
Calcium: 9 mg/dL (ref 8.9–10.3)
Chloride: 106 mmol/L (ref 98–111)
Creatinine, Ser: 1.05 mg/dL — ABNORMAL HIGH (ref 0.44–1.00)
GFR, Estimated: >60 mL/min (ref >60)
Glucose, Bld: 105 mg/dL — ABNORMAL HIGH (ref 70–99)
Potassium: 3.7 mmol/L (ref 3.5–5.1)
Sodium: 142 mmol/L (ref 135–145)
Total Bilirubin: 0.9 mg/dL (ref 0.0–1.2)
Total Protein: 5.9 g/dL — ABNORMAL LOW (ref 6.5–8.1)

## 2024-03-24 LAB — HEPATIC FUNCTION PANEL
ALT: 43 U/L (ref 0–44)
AST: 35 U/L (ref 15–41)
Albumin: 3.5 g/dL (ref 3.5–5.0)
Alkaline Phosphatase: 74 U/L (ref 38–126)
Bilirubin, Direct: 0.2 mg/dL (ref 0.0–0.2)
Indirect Bilirubin: 0.8 mg/dL (ref 0.3–0.9)
Total Bilirubin: 1 mg/dL (ref 0.0–1.2)
Total Protein: 6.8 g/dL (ref 6.5–8.1)

## 2024-03-24 LAB — HIV ANTIBODY (ROUTINE TESTING W REFLEX): HIV Screen 4th Generation wRfx: NONREACTIVE

## 2024-03-24 LAB — SEDIMENTATION RATE: Sed Rate: 1 mm/h (ref 0–22)

## 2024-03-24 LAB — LACTIC ACID, PLASMA: Lactic Acid, Venous: 1.4 mmol/L (ref 0.5–1.9)

## 2024-03-24 MED ORDER — PROCHLORPERAZINE EDISYLATE 10 MG/2ML IJ SOLN
5.0000 mg | Freq: Once | INTRAMUSCULAR | Status: AC
  Administered 2024-03-24: 5 mg via INTRAVENOUS
  Filled 2024-03-24: qty 2

## 2024-03-24 MED ORDER — CALCIUM CARBONATE ANTACID 500 MG PO CHEW: 1.0000 | CHEWABLE_TABLET | ORAL | Status: DC | PRN

## 2024-03-24 MED ORDER — ASPIRIN 81 MG PO CHEW
81.0000 mg | CHEWABLE_TABLET | ORAL | Status: AC
  Administered 2024-03-25: 81 mg via ORAL
  Filled 2024-03-24: qty 1

## 2024-03-24 MED ORDER — FREE WATER
250.0000 mL | Freq: Once | Status: AC
  Administered 2024-03-25: 250 mL via ORAL

## 2024-03-24 MED ORDER — FUROSEMIDE 10 MG/ML IJ SOLN
80.0000 mg | Freq: Once | INTRAMUSCULAR | Status: AC
  Administered 2024-03-24: 80 mg via INTRAVENOUS
  Filled 2024-03-24: qty 8

## 2024-03-24 NOTE — Consult Note (Addendum)
 Cardiology Consultation   Patient ID: Nancy [name] MRN: [id number]; DOB: [birth year]  Admit date: 03/23/2024 Date of Consult: 03/24/2024  PCP:  Melba Lamarr BRAVO, NP   [facility] Providers Cardiologist:  None        Patient Profile: Nancy [name] is a 48 y.o. female with a hx of hypertension who is being seen 03/24/2024 for the evaluation of chest pain and presyncope during stress test at the request of Masiku Mdala-Gausi.  History of Present Illness: [name] is new to Heart Care. Her cardiologist is with [facility] center. She established care with them one month ago after she presented to [facility] 01/2024 for shortness of breath, abdominal pain, and peripheral edema. Her AST/ALT was elevated and her UDS was positive for cocaine and meth. Pro BNP 6000. She was given IV lasix  and offered admission, but declined and left AMA. She then represented 02/2024 for sharp left sided chest pain. Associated symptoms of palpitations, shortness of breath, nausea, and diaphoresis. Her pro- BNP was 5,290. Negative troponins and D-dimer. CTA A/P showed mild pulmonary hypertension, mild cardiomegaly, stable lung nodules, and liver lesion. She was discharged home with follow-up with  GI.  On 8/1 at [facility] had a liver biopsy for mass seen on CT.   Yesterday, patient was at [facility] undergoing a stress test when she had reoccurrence of left sided chest pain with lightheadedness, dizziness, and vision disturbance. She was helped off the treadmill, and she presented to the ED for evaluation.  In the ED:  ECG sinus and possible biatrial enlargement, RAD, with TWI in inferior and V3-V6 leads. Similar appearence to ECG  02/2024 Pro-BNP 6257. Negative troponins. Negative D-Dimer Received IV lasix  40 mg in the ED, now on oral lasix  40 mg Since admission: WBC 11.2 -> 9.6 Cr 1.21 -> 1.05 AST 49 -> 33 ALT 62 -> 44 Blood pressure has been low to normal.   Patient reported that  the chest pain felt heavy with radiation to the left breast.It is worse with inspiration which makes it feel more sharp. She shared she has been having progressive angina and DOE since her heart failure diagnosis. She now cannot walk to her mailbox without stopping twice. Denied orthopnea, PND, nausea, emesis, abdominal pain. She does occasionally notice palpitations. She has never had an MI or syncopal episode.  Shared she has not been peeing much on oral lasix  dose. Only used the restroom once after her dose this morning. She also only peed twice after IV lasix  yesterday,   Family history included grandmother dying of an aneurysm of her aorta ~ 49 years old. Maternal Aunt CABG late 17s.  EoTH: less than 5 drinks a week Tobacco: Half pack of cigarettes a day for 30 years No marijuana, heroin, or opiate use. Reported recreational cocaine and meth use. Described meth use as weekly and less than 1g.   Past Medical History:  Diagnosis Date   Diabetes mellitus without complication (HCC)    prediabetes   Heart failure (HCC)    Hypertension     Past Surgical History:  Procedure Laterality Date   CESAREAN SECTION         Scheduled Meds:  aspirin   81 mg Oral Daily   enoxaparin  (LOVENOX ) injection  40 mg Subcutaneous Q24H   furosemide   40 mg Oral Daily   losartan   50 mg Oral Daily   sodium chloride  flush  3 mL Intravenous Q12H   Continuous Infusions:  PRN Meds: acetaminophen  **  OR** acetaminophen , ondansetron  **OR** ondansetron  (ZOFRAN ) IV, senna-docusate  Allergies:   No Known Allergies  Social History:   Social History   Socioeconomic History   Marital status: Single    Spouse name: Not on file   Number of children: Not on file   Years of education: Not on file   Highest education level: Not on file  Occupational History   Not on file  Tobacco Use   Smoking status: Every Day    Current packs/day: 1.00    Average packs/day: 1 pack/day for 20.0 years (20.0 ttl pk-yrs)     Types: Cigarettes   Smokeless tobacco: Never  Vaping Use   Vaping status: Former  Substance and Sexual Activity   Alcohol use: Yes    Comment: every other day   Drug use: Not Currently    Types: Cocaine    Comment: pills   Sexual activity: Yes    Birth control/protection: None  Other Topics Concern   Not on file  Social History Narrative   Not on file   Social Drivers of Health   Financial Resource Strain: Not on file  Food Insecurity: No Food Insecurity (03/23/2024)   Hunger Vital Sign    Worried About Running Out of Food in the Last Year: Never true    Ran Out of Food in the Last Year: Never true  Recent Concern: Food Insecurity - Medium Risk (03/12/2024)   Received from [facility]   Hunger Vital Sign    Within the past 12 months, you worried that your food would run out before you got money to buy more: Never true    Within the past 12 months, the food you bought just didn't last and you didn't have money to get more. : Sometimes true  Transportation Needs: No Transportation Needs (03/23/2024)   PRAPARE - Administrator, Civil Service (Medical): No    Lack of Transportation (Non-Medical): No  Physical Activity: Not on file  Stress: Not on file  Social Connections: Not on file  Intimate Partner Violence: Not At Risk (03/23/2024)   Humiliation, Afraid, Rape, and Kick questionnaire    Fear of Current or Ex-Partner: No    Emotionally Abused: No    Physically Abused: No    Sexually Abused: No    Family History:   History reviewed. No pertinent family history.   ROS:  Please see the history of present illness.  All other ROS reviewed and negative.     Physical Exam/Data: Vitals:   03/24/24 0330 03/24/24 0537 03/24/24 0721 03/24/24 0835  BP: (!) 115/90  112/85   Pulse: 88  85   Resp: 18  18 18   Temp: 97.8 F (36.6 C)  97.7 F (36.5 C)   TempSrc: Oral  Oral Oral  SpO2: 98%  99%   Weight:  56.1 kg    Height:        Intake/Output Summary (Last 24  hours) at 03/24/2024 1210 Last data filed at 03/24/2024 0900 Gross per 24 hour  Intake 436 ml  Output 200 ml  Net 236 ml      03/24/2024    5:37 AM 03/23/2024    9:10 PM 03/23/2024   12:47 PM  Last 3 Weights  Weight (lbs) 123 lb 10.9 oz 123 lb 11.2 oz 121 lb 14.6 oz  Weight (kg) 56.1 kg 56.11 kg 55.3 kg     Body mass index is 24.15 kg/m.  General:  In no acute distress HEENT: normal  Neck: no JVD Vascular: Distal pulses 1+ bilaterally Cardiac:  normal S1, S2; RRR; no murmur. Tenderness to palpation of chest wall centrally and to the left.  Lungs:  clear to auscultation bilaterally, no wheezing, rhonchi or rales  Abd: soft, nontender,  Ext: no edema Musculoskeletal:  No deformities, BUE and BLE strength normal and equal Skin: warm and dry  Neuro:  CNs 2-12 intact, no focal abnormalities noted Psych:  Normal affect   EKG:  The EKG was personally reviewed and demonstrates:  See HPI Telemetry:  Telemetry was personally reviewed and demonstrates:  Sinus with TWI HR 90s  Laboratory Data: High Sensitivity Troponin:  No results for input(s): TROPONINIHS in the last 720 hours.   Chemistry Recent Labs  Lab 03/23/24 1322 03/24/24 0339  NA 141 142  K 3.8 3.7  CL 102 106  CO2 26 26  GLUCOSE 81 105*  BUN 18 16  CREATININE 1.21* 1.05*  CALCIUM  9.4 9.0  MG  --  2.2  GFRNONAA 56* >60  ANIONGAP 13 10    Recent Labs  Lab 03/23/24 1322 03/24/24 0339  PROT 6.7 5.9*  ALBUMIN  4.2 3.1*  AST 49* 33  ALT 62* 44  ALKPHOS 85 64  BILITOT 0.5 0.9   Lipids No results for input(s): CHOL, TRIG, HDL, LABVLDL, LDLCALC, CHOLHDL in the last 168 hours.  Hematology Recent Labs  Lab 03/23/24 1322 03/24/24 0339  WBC 11.2* 9.6  RBC 5.30* 5.18*  HGB 16.0* 15.6*  HCT 47.8* 46.9*  MCV 90.2 90.5  MCH 30.2 30.1  MCHC 33.5 33.3  RDW 13.7 13.6  PLT 229 227   Thyroid No results for input(s): TSH, FREET4 in the last 168 hours.  BNP Recent Labs  Lab 03/23/24 1322  PROBNP  6,257.0*    DDimer  Recent Labs  Lab 03/23/24 1342  DDIMER 0.30    Radiology/Studies:  DG Chest 2 View Result Date: 03/23/2024 EXAM: 2 VIEW(S) XRAY OF THE CHEST 03/23/2024 01:52:00 PM COMPARISON: 02/28/2024 CLINICAL HISTORY: CP. PT was doing her stress test for her heart and became syncopal. Pt was seen here for Chest pain in July. HX: Cardiology patient, HTN, Heart failure, diabetes. FINDINGS: LUNGS AND PLEURA: No focal pulmonary opacity. No pulmonary edema. No pleural effusion. No pneumothorax. HEART AND MEDIASTINUM: Mild cardiomegaly. BONES AND SOFT TISSUES: No acute osseous abnormality. IMPRESSION: 1. Mild cardiomegaly, without congestive failure . Electronically signed by: Rockey Kilts MD 03/23/2024 02:02 PM EDT RP Workstation: [id number]     Assessment and Plan: Severe RV Failure Presyncope Patient denied full syncopal episode, only presyncope. She reported progressive DOE and presyncope with exertion at home. Denied orthopnea, PND, and early satiety.  Echocardiogram shows severe RV failure with severely enlarged RV and RA. LVEF 60-65% with no RWMA. Small pericardial effusion.   ProBNP 6257.  Patient reported low urine output with oral lasix  40 mg. I/Os may not be accurate. Her Cr and AST/ALT improved with diuresis.  Lactic acid pending On exam patient appears euvolemic, though I would continue to diuresis.   Patient has had multiple CTA studies and d-dimers since 01/2024 all showing no evidence of PE. She has no known chronic respiratory disease. Patient denied congential heart disease. Etiology of patient's heart failure remains unknown at this time, would recommend further work-up with RHC/LHC.   Would benefit from CHF consult  -continue lasix  40 mg daily    Atypical chest pain Patient describes chest pain as heavy and worse with exertion, though also with a pleuritic component of  worse with inspiration.  During interview patient was having an episode of chest pain that she  described as her GERD symptoms.  ECG shows TWI Negative troponins  She has no prior ischemic cardiac history. Echocardiogram showed normal LVEF and no RWMA. Though I do not suspect ACS, will obtain RHC/LHC to work-up severe RV failure etiology.  Hypertension BP has been low-normal this admission.  Most recent BP: 112/85 Patient reported she stopped taking this medication 3 days prior to admission.  Continue Cozaar  50 mg with hold parameters  Polysubstance use  Patient reports recreational cocaine and meth use which most likely impacting her cardiac function.  Will educate on cessation during this admission.    Risk Assessment/Risk Scores:       New [place]  Heart Association (NYHA) Functional Class NYHA Class III       For questions or updates, please contact [facility] Please consult [email or web address] for contact info under    Signed, Leontine LOISE Salen, PA-C  03/24/2024 12:10 PM  Patient seen, examined. Available data reviewed. Agree with findings, assessment, and plan as outlined by Leontine Salen, PA-C.,  The patient is independently interviewed and examined.  Her mother is present at the bedside.  She is alert, oriented, in no distress.  HEENT is normal, JVP is elevated, carotid upstrokes are normal, there are no bruits.  Heart is regular rate and rhythm with no murmur.  P2 is prominent.  I do not appreciate an RV lift or heave.  Abdomen is soft and nontender with mild distention.  Lower extremities have no edema.  The right leg is cooler than the left.  There is no rash.  EKG shows sinus tachycardia, right atrial enlargement, right axis deviation, and RVH with strain.  The patient's echocardiogram is personally reviewed and shows normal LVEF with a small left ventricle, severe RV dilatation with severe RV dysfunction as well as severe right atrial enlargement.  There is a small pericardial effusion present.  There is no significant mitral or tricuspid regurgitation.  The  patient's lab data is reviewed and shows a BNP of 6257, normal high-sensitivity troponin of 16, initial creatinine of 1.2 and follow-up creatinine of 1.05 after IV diuresis.  The patient reports a 86-month history of progressive fatigue, lightheadedness and near syncope, and shortness of breath.  She has been undergoing outpatient evaluation at [facility] and is admitted after having near syncope on the treadmill.  Her echo raises high suspicion for the presence of severe pulmonary hypertension and right heart failure.  Doppler findings include flow reversal in the hepatic veins.  Potential etiologies include primary pulmonary hypertension versus drug-induced pulmonary hypertension in this patient with history of cocaine and methamphetamine use.  I think much less likely are the possibility of a congenital lesion with left-to-right shunt or chronic thromboembolic disease.  Recommend advanced heart failure consultation.  Case discussed with Dr. Rolan.  The patient will need right and left heart catheterization with full hemodynamic assessment. I have reviewed the risks, indications, and alternatives to cardiac catheterization, possible angioplasty, and stenting with the patient. Risks include but are not limited to bleeding, infection, vascular injury, stroke, myocardial infection, arrhythmia, kidney injury, radiation-related injury in the case of prolonged fluoroscopy use, emergency cardiac surgery, and death. The patient understands the risks of serious complication is 1-2 in 1000 with diagnostic cardiac cath and 1-2% or less with angioplasty/stenting.  Plans and recommendations reviewed with the patient and her mother who understand and provide full informed consent for  cardiac catheterization.  Ozell Fell, M.D. 03/24/2024 3:41 PM

## 2024-03-24 NOTE — Progress Notes (Signed)
 Patient's HR went up to 140s and 150s. RN checked the patient in the room and patient claimed she just went out from the bathroom to void. She also reports chest pain 3/10 pressure like, dizziness and lightheadedness. Vital signs taken. MD notified.

## 2024-03-24 NOTE — TOC Initial Note (Signed)
 Transition of Care [facility]) - Initial/Assessment Note    Patient Details  Name: Nancy [name] MRN: [id number] Date of Birth: 1976/03/31  Transition of Care [facility]) CM/SW Contact:    Nancy Delcia Czar, RN Phone Number: [phone] 03/24/2024, 5:53 PM  Clinical Narrative:                 Spoke to pt, she works full-time. Will need note for work. States she was independent pta. States she has scale for daily weights. Provided pt with Living Better with HF. Reviewed daily weights and low sodium/heart healthy diet. She was teary eyed at diagnosis and upcoming test. Discussed smoking cessation. Encouraged her to discuss smoking cessation program to assist with quitting.   Will arrange hospital follow up with PCP on day of dc.    Expected Discharge Plan: Home/Self Care Barriers to Discharge: Continued Medical Work up   Patient Goals and CMS Choice Patient states their goals for this hospitalization and ongoing recovery are:: wants to recover          Expected Discharge Plan and Services   Discharge Planning Services: CM Consult   Living arrangements for the past 2 months: Single Family Home                                      Prior Living Arrangements/Services Living arrangements for the past 2 months: Single Family Home Lives with:: Parents Patient language and need for interpreter reviewed:: Yes Do you feel safe going back to the place where you live?: Yes      Need for Family Participation in Patient Care: No (Comment) Care giver support system in place?: Yes (comment) Current home services: DME (scale) Criminal Activity/Legal Involvement Pertinent to Current Situation/Hospitalization: No - Comment as needed  Activities of Daily Living   ADL Screening (condition at time of admission) Independently performs ADLs?: Yes (appropriate for developmental age) Is the patient deaf or have difficulty hearing?: No Does the patient have difficulty seeing, even when  wearing glasses/contacts?: No Does the patient have difficulty concentrating, remembering, or making decisions?: No  Permission Sought/Granted Permission sought to share information with : Case Manager, PCP, Family Supports Permission granted to share information with : Yes, Verbal Permission Granted  Share Information with NAME: Nancy [name]  Permission granted to share info w AGENCY: PCP, DME  Permission granted to share info w Relationship: mother  Permission granted to share info w Contact Information: [phone]  Emotional Assessment Appearance:: Appears stated age Attitude/Demeanor/Rapport: Engaged Affect (typically observed): Accepting Orientation: : Oriented to Self, Oriented to Place, Oriented to  Time, Oriented to Situation   Psych Involvement: No (comment)  Admission diagnosis:  Precordial chest pain [R07.2] CHF (congestive heart failure) (HCC) [I50.9] Syncope, unspecified syncope type [R55] Patient Active Problem List   Diagnosis Date Noted   Acute right-sided heart failure (HCC) 03/24/2024   Congestive heart failure with unknown left ventricular ejection fraction (HCC) 03/23/2024   Near syncope 03/23/2024   Essential hypertension 03/23/2024   Liver mass 03/23/2024   Tobacco use 03/23/2024   Acute CHF (congestive heart failure) (HCC) 02/07/2024   PCP:  Nancy Lamarr BRAVO, NP Pharmacy:   [id number] - [place], [place] - [address] AT [address] [place] [address] Phone: [phone] Fax: [phone]  [id number] - [place], [place] -  [address] AT [facility] [address] [place] [address] Phone: [phone] Fax: [phone]     Social Drivers of Health (SDOH) Social History: SDOH Screenings   Food Insecurity: No Food Insecurity (03/23/2024)  Recent Concern: Food Insecurity - Medium Risk (03/12/2024)   Received from [facility]  Housing:  Low Risk  (03/23/2024)  Transportation Needs: No Transportation Needs (03/23/2024)  Utilities: Not At Risk (03/23/2024)  Tobacco Use: High Risk (03/23/2024)   SDOH Interventions:     Readmission Risk Interventions    03/24/2024    2:51 PM  Readmission Risk Prevention Plan  Medication Screening Complete  Transportation Screening Complete

## 2024-03-24 NOTE — Plan of Care (Signed)
   Problem: Education: Goal: Knowledge of General Education information will improve Description: Including pain rating scale, medication(s)/side effects and non-pharmacologic comfort measures Outcome: Progressing   Problem: Clinical Measurements: Goal: Cardiovascular complication will be avoided Outcome: Progressing

## 2024-03-24 NOTE — TOC CM/SW Note (Signed)
 Transition of Care [facility]) - Inpatient Brief Assessment   Patient Details  Name: Nancy [name] MRN: [id number] Date of Birth: [birth year]  Transition of Care [facility]) CM/SW Contact:    Waddell Barnie Rama, RN Phone Number: 03/24/2024, 2:52 PM   Clinical Narrative: From home with mom, has PCP and insurance on file, states has no HH services in place at this time or DME at home.  States family member  (mom or friend) will transport them home at [employer] and family is support system, states gets medications from [facility] on [address] in [place].  Pta self ambulatory.   There are no IP CM  needs identified  at this time.  Please place consult for IP CM  needs.     Transition of Care Asessment: Insurance and Status: Insurance coverage has been reviewed Patient has primary care physician: Yes Home environment has been reviewed: home with mom Prior level of function:: indep Prior/Current Home Services: No current home services Social Drivers of Health Review: SDOH reviewed no interventions necessary Readmission risk has been reviewed: Yes Transition of care needs: no transition of care needs at this time

## 2024-03-24 NOTE — Consult Note (Addendum)
 Advanced Heart Failure Team Consult Note   Primary Physician: Melba Lamarr BRAVO, NP Cardiologist:  None  Reason for Consultation: RV Failure   HPI:    Nancy [name] is seen today for evaluation of RV Failure at the request of Dr Wonda .   Ms Ruddy is a 48 year old with with history of tobacco abuse, former cocaine, daily meth for 1 year. No previous cardiac history. No family history of pulmonary hypertension.   She went to the ED in June due to lower extremity edema. + Cocaine + Amphetamines on UDS. BNP 6000. She was told she had heart failure and a mass on her liver. She declined admit. She elected to follow up with PCP. She has been taking losartan  and lasix . She was referred GI for liver biopsy and to cardiology.  She returned to [facility] ED in July with atypical chest pain. CTA - negative for dissection, pulmonary nodules, and hepatic mass. She was later discharged with cardiology follow up.  Over the last few weeks she has noticed progressive shortness of breath and presyncope on a daily basis.   On 03/20/24 she had liver biopsy. Yesterday she was sent for a stress test and but had to stop due to chest pain and presyncope. She did not complete the test and went to the ED.   Pertinent labs included HS Trop 16>15, proBNP 6257, WBC 11, Hgb 16, AST 49, ALT 49. CXR with mild cardiomegaly. Given 40 mg IV lasix . Echo LV 60-65%, severely RV Failure, RA severely dilated, and small pericardial effusion.      Home Medications Prior to Admission medications   Medication Sig Start Date End Date Taking? Authorizing Provider  EPINEPHrine  0.3 mg/0.3 mL IJ SOAJ injection Inject 0.3 mg into the muscle as needed for anaphylaxis. 05/22/23  Yes Schuman, Lynwood DASEN, PA-C  furosemide  (LASIX ) 20 MG tablet Take 1 tablet (20 mg total) by mouth daily for 5 days. Patient taking differently: Take 40 mg by mouth daily. 02/07/24 03/24/24 Yes Curatolo, Adam, DO  losartan  (COZAAR ) 50 MG tablet Take 50 mg by mouth  daily.   Yes [provider]  potassium chloride  (MICRO-K ) 10 MEQ CR capsule Take 10 mEq by mouth daily. 02/19/24  Yes [provider]  promethazine  (PHENERGAN ) 25 MG tablet Take 1 tablet (25 mg total) by mouth every 6 (six) hours as needed for nausea or vomiting. 06/25/14 03/27/19  Baxter Drivers, MD    Past Medical History: Past Medical History:  Diagnosis Date   Diabetes mellitus without complication (HCC)    prediabetes   Heart failure (HCC)    Hypertension     Past Surgical History: Past Surgical History:  Procedure Laterality Date   CESAREAN SECTION      Family History: History reviewed. No pertinent family history.  Social History: Social History   Socioeconomic History   Marital status: Single    Spouse name: Not on file   Number of children: Not on file   Years of education: Not on file   Highest education level: Not on file  Occupational History   Not on file  Tobacco Use   Smoking status: Every Day    Current packs/day: 1.00    Average packs/day: 1 pack/day for 20.0 years (20.0 ttl pk-yrs)    Types: Cigarettes   Smokeless tobacco: Never  Vaping Use   Vaping status: Former  Substance and Sexual Activity   Alcohol use: Yes    Comment: every other day  Drug use: Not Currently    Types: Cocaine    Comment: pills   Sexual activity: Yes    Birth control/protection: None  Other Topics Concern   Not on file  Social History Narrative   Not on file   Social Drivers of Health   Financial Resource Strain: Not on file  Food Insecurity: No Food Insecurity (03/23/2024)   Hunger Vital Sign    Worried About Running Out of Food in the Last Year: Never true    Ran Out of Food in the Last Year: Never true  Recent Concern: Food Insecurity - Medium Risk (03/12/2024)   Received from [facility]   Hunger Vital Sign    Within the past 12 months, you worried that your food would run out before you got money to buy more: Never true    Within the past 12  months, the food you bought just didn't last and you didn't have money to get more. : Sometimes true  Transportation Needs: No Transportation Needs (03/23/2024)   PRAPARE - Administrator, Civil Service (Medical): No    Lack of Transportation (Non-Medical): No  Physical Activity: Not on file  Stress: Not on file  Social Connections: Not on file    Allergies:  No Known Allergies  Objective:    Vital Signs:   Temp:  [97.6 F (36.4 C)-98.6 F (37 C)] 97.7 F (36.5 C) (08/05 0721) Pulse Rate:  [85-104] 85 (08/05 0721) Resp:  [10-23] 18 (08/05 0835) BP: (94-148)/(45-115) 112/85 (08/05 0721) SpO2:  [97 %-100 %] 99 % (08/05 0721) Weight:  [56.1 kg] 56.1 kg (08/05 0537) Last BM Date : 03/22/24  Weight change: Filed Weights   03/23/24 1247 03/23/24 2110 03/24/24 0537  Weight: 55.3 kg 56.1 kg 56.1 kg    Intake/Output:   Intake/Output Summary (Last 24 hours) at 03/24/2024 1512 Last data filed at 03/24/2024 1300 Gross per 24 hour  Intake 673 ml  Output 200 ml  Net 473 ml      Physical Exam    General:   Tearful. No resp difficulty Neck: no JVD.  Cor: Regular rate & rhythm. Lungs: clear Abdomen: soft, nontender, nondistended.  Extremities: no  edema Neuro: alert & oriented x3   Telemetry   SR   EKG    ST 107 BNP  Labs   Basic Metabolic Panel: Recent Labs  Lab 03/23/24 1322 03/24/24 0339  NA 141 142  K 3.8 3.7  CL 102 106  CO2 26 26  GLUCOSE 81 105*  BUN 18 16  CREATININE 1.21* 1.05*  CALCIUM  9.4 9.0  MG  --  2.2    Liver Function Tests: Recent Labs  Lab 03/23/24 1322 03/24/24 0339  AST 49* 33  ALT 62* 44  ALKPHOS 85 64  BILITOT 0.5 0.9  PROT 6.7 5.9*  ALBUMIN  4.2 3.1*   No results for input(s): LIPASE, AMYLASE in the last 168 hours. No results for input(s): AMMONIA in the last 168 hours.  CBC: Recent Labs  Lab 03/23/24 1322 03/24/24 0339  WBC 11.2* 9.6  NEUTROABS 8.2*  --   HGB 16.0* 15.6*  HCT 47.8* 46.9*  MCV 90.2  90.5  PLT 229 227    Cardiac Enzymes: No results for input(s): CKTOTAL, CKMB, CKMBINDEX, TROPONINI in the last 168 hours.  BNP: BNP (last 3 results) No results for input(s): BNP in the last 8760 hours.  ProBNP (last 3 results) Recent Labs    02/07/24 1809 02/28/24 1407 03/23/24  1322  PROBNP 6,349.0* 5,296.0* 6,257.0*     CBG: No results for input(s): GLUCAP in the last 168 hours.  Coagulation Studies: No results for input(s): LABPROT, INR in the last 72 hours.   Imaging   ECHOCARDIOGRAM COMPLETE Result Date: 03/24/2024    ECHOCARDIOGRAM REPORT   Patient Name:   BECKIE VISCARDI Date of Exam: 03/24/2024 Medical Rec #:  [id number]         Height:       60.0 in Accession #:    [id number]        Weight:       123.7 lb Date of Birth:  1975-11-11        BSA:          1.522 m Patient Age:    47 years          BP:           112/85 mmHg Patient Gender: F                 HR:           97 bpm. Exam Location:  Inpatient Procedure: 2D Echo, Cardiac Doppler and Color Doppler (Both Spectral and Color            Flow Doppler were utilized during procedure). Indications:    CHF  History:        Patient has no prior history of Echocardiogram examinations.                 CHF, Signs/Symptoms:Syncope; Risk Factors:Current Smoker and                 Hypertension.  Sonographer:    Therisa Crouch Referring Phys: [id number] VISHAL R PATEL IMPRESSIONS  1. Severe right ventricular heart failure. Patient had a CTA negative for PE 02/07/24. Consider CHF consult and right heart cath.  2. Left ventricular ejection fraction, by estimation, is 60 to 65%. The left ventricle has normal function. The left ventricle has no regional wall motion abnormalities. Left ventricular diastolic parameters are consistent with Grade I diastolic dysfunction (impaired relaxation).  3. Right ventricular systolic function is severely reduced. The right ventricular size is severely enlarged.  4. Right atrial size was severely  dilated.  5. A small pericardial effusion is present. The pericardial effusion is posterior to the left ventricle.  6. The mitral valve is normal in structure. No evidence of mitral valve regurgitation. No evidence of mitral stenosis.  7. The aortic valve is tricuspid. Aortic valve regurgitation is not visualized. No aortic stenosis is present.  8. The inferior vena cava is dilated in size with >50% respiratory variability, suggesting right atrial pressure of 8 mmHg. FINDINGS  Left Ventricle: Left ventricular ejection fraction, by estimation, is 60 to 65%. The left ventricle has normal function. The left ventricle has no regional wall motion abnormalities. Strain was performed and the global longitudinal strain is indeterminate. The left ventricular internal cavity size was normal in size. There is no left ventricular hypertrophy. Left ventricular diastolic parameters are consistent with Grade I diastolic dysfunction (impaired relaxation). Right Ventricle: The right ventricular size is severely enlarged. No increase in right ventricular wall thickness. Right ventricular systolic function is severely reduced. Left [facility]: Left atrial size was normal in size. Right [facility]: Right atrial size was severely dilated. Pericardium: A small pericardial effusion is present. The pericardial effusion is posterior to the left ventricle. Mitral Valve: The mitral valve is normal in structure. No evidence of mitral  valve regurgitation. No evidence of mitral valve stenosis. Tricuspid Valve: The tricuspid valve is normal in structure. Tricuspid valve regurgitation is mild . No evidence of tricuspid stenosis. Aortic Valve: The aortic valve is tricuspid. Aortic valve regurgitation is not visualized. No aortic stenosis is present. Aortic valve mean gradient measures 2.0 mmHg. Aortic valve peak gradient measures 3.1 mmHg. Aortic valve area, by VTI measures 2.53 cm. Pulmonic Valve: The pulmonic valve was normal in structure. Pulmonic  valve regurgitation is not visualized. No evidence of pulmonic stenosis. Aorta: The aortic root is normal in size and structure. Venous: The inferior vena cava is dilated in size with greater than 50% respiratory variability, suggesting right atrial pressure of 8 mmHg. IAS/Shunts: No atrial level shunt detected by color flow Doppler. Additional Comments: Severe right ventricular heart failure. Patient had a CTA negative for PE 02/07/24. Consider CHF consult and right heart cath. 3D was performed not requiring image post processing on an independent workstation and was indeterminate.  LEFT VENTRICLE PLAX 2D LVIDd:         3.60 cm   Diastology LVIDs:         1.80 cm   LV e' medial:  4.46 cm/s LV PW:         1.10 cm   LV e' lateral: 6.64 cm/s LV IVS:        1.10 cm LVOT diam:     2.00 cm LV SV:         34 LV SV Index:   22 LVOT Area:     3.14 cm  RIGHT VENTRICLE            IVC RV Basal diam:  4.60 cm    IVC diam: 2.40 cm RV Mid diam:    4.60 cm RV S prime:     6.64 cm/s TAPSE (M-mode): 1.2 cm LEFT ATRIUM         Index       RIGHT ATRIUM           Index LA diam:    2.40 cm 1.58 cm/m  RA Area:     23.20 cm                                 RA Volume:   77.60 ml  50.99 ml/m  AORTIC VALVE AV Area (Vmax):    2.48 cm AV Area (Vmean):   2.33 cm AV Area (VTI):     2.53 cm AV Vmax:           87.50 cm/s AV Vmean:          67.200 cm/s AV VTI:            0.134 m AV Peak Grad:      3.1 mmHg AV Mean Grad:      2.0 mmHg LVOT Vmax:         69.10 cm/s LVOT Vmean:        49.900 cm/s LVOT VTI:          0.108 m LVOT/AV VTI ratio: 0.81  AORTA Ao Root diam: 3.20 cm Ao Asc diam:  2.60 cm TRICUSPID VALVE TR Peak grad:   70.2 mmHg TR Vmax:        419.00 cm/s  SHUNTS Systemic VTI:  0.11 m Systemic Diam: 2.00 cm Maude Emmer MD Electronically signed by Maude Emmer MD Signature Date/Time: 03/24/2024/2:41:15 PM    Final  Medications:     Current Medications:  aspirin   81 mg Oral Daily   enoxaparin  (LOVENOX ) injection  40 mg  Subcutaneous Q24H   furosemide   40 mg Oral Daily   losartan   50 mg Oral Daily   sodium chloride  flush  3 mL Intravenous Q12H    Infusions:     Patient Profile   Ms Pralle is a 48 year old with with history of tobacco abuse, former cocaine, daily meth for 1 year. No previous cardiac history. No family history of pulmonary hypertension.   Admitted with presyncope--> severe RV failure  Assessment/Plan  1. Presyncope, RV Failure, PH  CTA 02/2024-no PE pulmonary noduless Possible Etiology, meth. Pro BNP 6257 Echo RV severely reduced.  Volume status stable. Continue oral lasix .  Check ANA, ANCA, RF, Anticentromere Antibody, Sed Rate, SCL70, CCP, Hepatitis panel.  Order VQ scan.  Will need RHC?LHC  tomorrow to further assess.  Informed Consent   Shared Decision Making/Informed Consent The risks, including but not limited to, [bleeding or vascular complications (1 in 500), pneumothorax (1 in 1600), arrhythmia (1 in 1000) and death (1 in 5000)], benefits (diagnostic support and/or management of heart failure, pulmonary hypertension) and alternatives of a right heart catheterization were discussed in detail with Ms. Haney and she is willing to proceed.   She will also need outpatient sleep study.   2. Substance Abuse Daily Meth. Discussed cessation.  May need rehab. Will ask SW to see.   3. Liver Lesion S/P biopsy 8/[facility] .   Length of Stay: 1  Amy Clegg, NP  03/24/2024, 3:12 PM    Advanced Heart Failure Team Pager [phone] (M-F; 7a - 5p)  Please contact [facility] for night-coverage after hours (4p -7a ) and weekends on [email or web address]  Patient seen with NP, I formulated the plan and agree with the above note.   She was admitted with worsening exertional dyspnea and chest pain.  She developed presyncope on a stress test and had to stop.  At admission, pro-BNP was 6257, hs-TnI was normal.  Recent CTA chest showed no PE and no severe lung parenchymal disease.  She uses  cocaine at times and has been using methamphetamine daily x for a month.  She smokes.  She occasionally drinks ETOH.   I reviewed her echo, EF 60-65%, severe RV enlargement and severe RV dysfunction, severe RAE, LV compressed by RV, small pericardial effusion.  ECG shows RVH.   General: NAD Neck: JVP 10 cm, no thyromegaly or thyroid nodule.  Lungs: Clear to auscultation bilaterally with normal respiratory effort. CV: Nondisplaced PMI.  Heart regular S1/S2, no S3/S4, no murmur.  Trace ankle edema.  No carotid bruit.  Normal pedal pulses.  Abdomen: Soft, nontender, no hepatosplenomegaly, no distention.  Skin: Intact without lesions or rashes.  Neurologic: Alert and oriented x 3.  Psych: Normal affect. Extremities: No clubbing or cyanosis.  HEENT: Normal.   1. RV failure: Echo this admission with EF 60-65%, severe RV enlargement and severe RV dysfunction, severe RAE, LV compressed by RV, small pericardial effusion.  ECG shows RVH.  I strongly suspect severe pulmonary hypertension though PA pressures could not be estimated from the echo.  She has had symptoms for about 2 months, getting quite severe with exertional dyspnea and chest pain as well as presyncope.  She has had 2 CTA chest studies in the last 2 months with no PE and minimal lung parenchymal disease.  No known rheumatologic disorder.  No FH of pulmonary  hypertension/RV failure.  She uses amphetamines regularly and also cocaine, both can be causes of pulmonary hypertension. She has a liver mass by CT that has been biopsied but not cirrhosis, doubt portopulmonary hypertension. She is mildly volume overloaded on exam today.  - Lasix  80 mg IV x 1 and follow response.  - Eventual SGLT2 inhibitor/spironolactone.  - She needs RHC/LHC.  Need to assess pulmonary artery pressure/filling pressures, also will assess coronaries with exertional chest pain and long-standing smoking history.  Discussed risks/benefits and she agrees to procedure.  - V/Q  scan to rule out chronic PE.  - Send rheumatologic serologies.  - She will need to stop amphetamines/cocaine.  2. Liver lesion: Patient was found incidentally by CT to have liver mass but not cirrhosis.   - S/p liver biopsy, report not available yet.  3. Smoking: I encouraged her to quit.   Ezra Shuck 03/24/2024 4:48 PM

## 2024-03-24 NOTE — Plan of Care (Signed)
 Patient pain free today and denies shortness of breath. Patient for cardiac cath in the am.

## 2024-03-24 NOTE — Progress Notes (Signed)
 Progress Note   Patient: Nancy [name] FMW:[id number] DOB: 1975-10-11 DOA: 03/23/2024     1 DOS: the patient was seen and examined on 03/24/2024   Brief hospital course: 48 year old woman with PMH of recently diagnosed CHF, HTN, liver mass, tobacco use who is admitted for evaluation of a near syncopal episode occurring while undergoing exercise stress test.  Cardiology was consulted.  Assessment and Plan:  Presyncope  Occurred during stress test.  Patient had associated chest pain. Likely related to her right heart failure. - Cardiology consulted, input appreciated  Severe right ventricular failure Troponin negative x 2. BNP 6257 EKG shows chronic nonspecific ST changes. TTE done 8/5 shows severe right ventricular heart failure.  LVEF 60 to 65%.  No regional wall motion abnormalities.  Grade 1 diastolic dysfunction.  Severe right ventricular enlargement, severe right atrial enlargement. Patient with no evidence of PE or chronic respiratory disease. Cardiology consulted.  Input appreciated. - Would benefit from CHF consult. - Cardiology recommend further workup with RHC/LHC. - Continue Lasix  daily  Hypertension Blood pressure low normal. - Continue home losartan  with hold parameters.  Substance use disorder Recent urine drug screen was positive for cocaine, amphetamines. - Encourage cessation.     Subjective: Patient says she feels all right.  Physical Exam: Vitals:   03/24/24 0330 03/24/24 0537 03/24/24 0721 03/24/24 0835  BP: (!) 115/90  112/85   Pulse: 88  85   Resp: 18  18 18   Temp: 97.8 F (36.6 C)  97.7 F (36.5 C)   TempSrc: Oral  Oral Oral  SpO2: 98%  99%   Weight:  56.1 kg    Height:       Physical Exam   General: Alert, oriented X3  Eyes: Pupils equal, reactive  Oral cavity: moist mucous membranes  Head: Atraumatic, normocephalic  Neck: supple  Chest: clear to auscultation. No crackles, no wheezes  CVS: S1,S2 RRR. No murmurs  Abd: No distention,  soft, non-tender. No masses palpable  Extr: No edema   MSK: No joint deformities or swelling  Neurological: Grossly intact.    Data Reviewed:     Latest Ref Rng & Units 03/24/2024    3:39 AM 03/23/2024    1:22 PM 02/28/2024    2:02 PM  CBC  WBC 4.0 - 10.5 K/uL 9.6  11.2  8.9   Hemoglobin 12.0 - 15.0 g/dL 84.3  83.9  84.4   Hematocrit 36.0 - 46.0 % 46.9  47.8  46.6   Platelets 150 - 400 K/uL 227  229  311       Latest Ref Rng & Units 03/24/2024    3:39 AM 03/23/2024    1:22 PM 02/28/2024    2:02 PM  BMP  Glucose 70 - 99 mg/dL 894  81  85   BUN 6 - 20 mg/dL 16  18  24    Creatinine 0.44 - 1.00 mg/dL 8.94  8.78  8.80   Sodium 135 - 145 mmol/L 142  141  141   Potassium 3.5 - 5.1 mmol/L 3.7  3.8  3.5   Chloride 98 - 111 mmol/L 106  102  103   CO2 22 - 32 mmol/L 26  26  22    Calcium  8.9 - 10.3 mg/dL 9.0  9.4  8.9      Family Communication: n/a  Disposition: Status is: Inpatient   Planned Discharge Destination: Home    Time spent: 35 minutes  Author: MDALA-GAUSI, Filemon Breton AGATHA, MD 03/24/2024 1:16 PM  For  on call review [email or web address].

## 2024-03-25 ENCOUNTER — Inpatient Hospital Stay (HOSPITAL_COMMUNITY)

## 2024-03-25 ENCOUNTER — Telehealth (HOSPITAL_COMMUNITY): Payer: Self-pay | Admitting: Pharmacy Technician

## 2024-03-25 ENCOUNTER — Encounter (HOSPITAL_COMMUNITY): Payer: Self-pay | Admitting: Cardiology

## 2024-03-25 ENCOUNTER — Other Ambulatory Visit (HOSPITAL_COMMUNITY): Payer: Self-pay

## 2024-03-25 ENCOUNTER — Inpatient Hospital Stay (HOSPITAL_COMMUNITY)
Admission: EM | Disposition: A | Discharge status: Home / Self Care | Payer: Self-pay | Source: Home / Self Care | Attending: Internal Medicine

## 2024-03-25 DIAGNOSIS — I509 Heart failure, unspecified: Secondary | ICD-10-CM | POA: Diagnosis not present

## 2024-03-25 HISTORY — PX: RIGHT/LEFT HEART CATH AND CORONARY ANGIOGRAPHY: CATH118266

## 2024-03-25 LAB — POCT I-STAT EG7
Acid-Base Excess: 3 mmol/L — ABNORMAL HIGH (ref 0.0–2.0)
Acid-Base Excess: 3 mmol/L — ABNORMAL HIGH (ref 0.0–2.0)
Bicarbonate: 29.7 mmol/L — ABNORMAL HIGH (ref 20.0–28.0)
Bicarbonate: 29.8 mmol/L — ABNORMAL HIGH (ref 20.0–28.0)
Calcium, Ion: 1.18 mmol/L (ref 1.15–1.40)
Calcium, Ion: 1.18 mmol/L (ref 1.15–1.40)
HCT: 51 % — ABNORMAL HIGH (ref 36.0–46.0)
HCT: 51 % — ABNORMAL HIGH (ref 36.0–46.0)
Hemoglobin: 17.3 g/dL — ABNORMAL HIGH (ref 12.0–15.0)
Hemoglobin: 17.3 g/dL — ABNORMAL HIGH (ref 12.0–15.0)
O2 Saturation: 58 %
O2 Saturation: 60 %
Potassium: 3.6 mmol/L (ref 3.5–5.1)
Potassium: 3.6 mmol/L (ref 3.5–5.1)
Sodium: 139 mmol/L (ref 135–145)
Sodium: 139 mmol/L (ref 135–145)
TCO2: 31 mmol/L (ref 22–32)
TCO2: 31 mmol/L (ref 22–32)
pCO2, Ven: 50.1 mmHg (ref 44–60)
pCO2, Ven: 51.7 mmHg (ref 44–60)
pH, Ven: 7.368 (ref 7.25–7.43)
pH, Ven: 7.38 (ref 7.25–7.43)
pO2, Ven: 32 mmHg (ref 32–45)
pO2, Ven: 32 mmHg (ref 32–45)

## 2024-03-25 LAB — CBC
HCT: 49.2 % — ABNORMAL HIGH (ref 36.0–46.0)
Hemoglobin: 15.9 g/dL — ABNORMAL HIGH (ref 12.0–15.0)
MCH: 29.7 pg (ref 26.0–34.0)
MCHC: 32.3 g/dL (ref 30.0–36.0)
MCV: 91.8 fL (ref 80.0–100.0)
Platelets: 243 K/uL (ref 150–400)
RBC: 5.36 MIL/uL — ABNORMAL HIGH (ref 3.87–5.11)
RDW: 13.6 % (ref 11.5–15.5)
WBC: 11.6 K/uL — ABNORMAL HIGH (ref 4.0–10.5)
nRBC: 0 % (ref 0.0–0.2)

## 2024-03-25 LAB — LIPID PANEL
Cholesterol: 167 mg/dL (ref 0–200)
HDL: 52 mg/dL (ref >40)
LDL Cholesterol: 86 mg/dL (ref 0–99)
Total CHOL/HDL Ratio: 3.2 ratio
Triglycerides: 145 mg/dL (ref <150)
VLDL: 29 mg/dL (ref 0–40)

## 2024-03-25 LAB — BASIC METABOLIC PANEL WITH GFR
Anion gap: 13 (ref 5–15)
BUN: 19 mg/dL (ref 6–20)
CO2: 25 mmol/L (ref 22–32)
Calcium: 8.9 mg/dL (ref 8.9–10.3)
Chloride: 99 mmol/L (ref 98–111)
Creatinine, Ser: 1.09 mg/dL — ABNORMAL HIGH (ref 0.44–1.00)
GFR, Estimated: >60 mL/min (ref >60)
Glucose, Bld: 94 mg/dL (ref 70–99)
Potassium: 3.3 mmol/L — ABNORMAL LOW (ref 3.5–5.1)
Sodium: 137 mmol/L (ref 135–145)

## 2024-03-25 LAB — TROPONIN I (HIGH SENSITIVITY): Troponin I (High Sensitivity): 18 ng/L — ABNORMAL HIGH (ref <18)

## 2024-03-25 LAB — MAGNESIUM: Magnesium: 2.1 mg/dL (ref 1.7–2.4)

## 2024-03-25 MED ORDER — FENTANYL CITRATE (PF) 100 MCG/2ML IJ SOLN
INTRAMUSCULAR | Status: AC
  Filled 2024-03-25: qty 2

## 2024-03-25 MED ORDER — LIDOCAINE HCL (PF) 1 % IJ SOLN
INTRAMUSCULAR | Status: DC | PRN
Start: 2024-03-25 — End: 2024-03-25
  Administered 2024-03-25 (×2): 5 mL

## 2024-03-25 MED ORDER — FENTANYL CITRATE (PF) 100 MCG/2ML IJ SOLN
INTRAMUSCULAR | Status: DC | PRN
  Administered 2024-03-25 (×2): 25 ug via INTRAVENOUS

## 2024-03-25 MED ORDER — VERAPAMIL HCL 2.5 MG/ML IV SOLN
INTRAVENOUS | Status: AC
  Filled 2024-03-25: qty 2

## 2024-03-25 MED ORDER — POTASSIUM CHLORIDE CRYS ER 20 MEQ PO TBCR
40.0000 meq | EXTENDED_RELEASE_TABLET | Freq: Once | ORAL | Status: DC
Start: 2024-03-25 — End: 2024-03-25

## 2024-03-25 MED ORDER — POTASSIUM CHLORIDE CRYS ER 20 MEQ PO TBCR
40.0000 meq | EXTENDED_RELEASE_TABLET | ORAL | Status: AC
  Administered 2024-03-25 (×2): 40 meq via ORAL
  Filled 2024-03-25 (×2): qty 2

## 2024-03-25 MED ORDER — SODIUM CHLORIDE 0.9% FLUSH
3.0000 mL | INTRAVENOUS | Status: DC | PRN
Start: 2024-03-25 — End: 2024-03-27

## 2024-03-25 MED ORDER — TADALAFIL 20 MG PO TABS
20.0000 mg | ORAL_TABLET | Freq: Every day | ORAL | Status: DC
  Administered 2024-03-25: 20 mg via ORAL
  Filled 2024-03-25 (×2): qty 1

## 2024-03-25 MED ORDER — MIDAZOLAM HCL 2 MG/2ML IJ SOLN
INTRAMUSCULAR | Status: DC | PRN
  Administered 2024-03-25: 1 mg via INTRAVENOUS

## 2024-03-25 MED ORDER — ONDANSETRON HCL 4 MG/2ML IJ SOLN: 4.0000 mg | Freq: Four times a day (QID) | INTRAMUSCULAR | Status: DC | PRN

## 2024-03-25 MED ORDER — FUROSEMIDE 10 MG/ML IJ SOLN
80.0000 mg | Freq: Once | INTRAMUSCULAR | Status: AC
  Administered 2024-03-25: 80 mg via INTRAVENOUS
  Filled 2024-03-25: qty 8

## 2024-03-25 MED ORDER — SODIUM CHLORIDE 0.9 % IV SOLN
INTRAVENOUS | Status: AC | PRN
  Administered 2024-03-25: 250 mL via INTRAVENOUS

## 2024-03-25 MED ORDER — ENOXAPARIN SODIUM 40 MG/0.4ML IJ SOSY
40.0000 mg | PREFILLED_SYRINGE | INTRAMUSCULAR | Status: DC
  Administered 2024-03-26 – 2024-03-27 (×2): 40 mg via SUBCUTANEOUS
  Filled 2024-03-25 (×2): qty 0.4

## 2024-03-25 MED ORDER — ACETAMINOPHEN 325 MG PO TABS: 650.0000 mg | ORAL_TABLET | ORAL | Status: DC | PRN

## 2024-03-25 MED ORDER — LIDOCAINE HCL (PF) 1 % IJ SOLN
INTRAMUSCULAR | Status: AC
  Filled 2024-03-25: qty 30

## 2024-03-25 MED ORDER — HYDRALAZINE HCL 20 MG/ML IJ SOLN
10.0000 mg | INTRAMUSCULAR | Status: AC | PRN
Start: 2024-03-25 — End: 2024-03-25

## 2024-03-25 MED ORDER — POTASSIUM CHLORIDE 20 MEQ PO PACK: 40.0000 meq | PACK | Freq: Once | ORAL | Status: DC

## 2024-03-25 MED ORDER — SODIUM CHLORIDE 0.9 % IV SOLN: INTRAVENOUS | Status: AC

## 2024-03-25 MED ORDER — MIDAZOLAM HCL 2 MG/2ML IJ SOLN
INTRAMUSCULAR | Status: AC
  Filled 2024-03-25: qty 2

## 2024-03-25 MED ORDER — SODIUM CHLORIDE 0.9 % IV SOLN: 250.0000 mL | INTRAVENOUS | Status: AC | PRN

## 2024-03-25 MED ORDER — LABETALOL HCL 5 MG/ML IV SOLN: 10.0000 mg | INTRAVENOUS | Status: AC | PRN

## 2024-03-25 MED ORDER — DIGOXIN 125 MCG PO TABS
0.1250 mg | ORAL_TABLET | Freq: Every day | ORAL | Status: DC
  Administered 2024-03-25 – 2024-03-27 (×3): 0.125 mg via ORAL
  Filled 2024-03-25 (×3): qty 1

## 2024-03-25 MED ORDER — SODIUM CHLORIDE 0.9% FLUSH
3.0000 mL | Freq: Two times a day (BID) | INTRAVENOUS | Status: DC
  Administered 2024-03-25 – 2024-03-27 (×4): 3 mL via INTRAVENOUS

## 2024-03-25 MED ORDER — HEPARIN (PORCINE) IN NACL 1000-0.9 UT/500ML-% IV SOLN
INTRAVENOUS | Status: DC | PRN
  Administered 2024-03-25: 1000 mL

## 2024-03-25 MED ORDER — IOHEXOL 350 MG/ML SOLN
INTRAVENOUS | Status: DC | PRN
  Administered 2024-03-25: 25 mL

## 2024-03-25 NOTE — Interval H&P Note (Signed)
 History and Physical Interval Note:  03/25/2024 12:49 PM  Nancy [name]  has presented today for surgery, with the diagnosis of pulmonary  hypertension.  The various methods of treatment have been discussed with the patient and family. After consideration of risks, benefits and other options for treatment, the patient has consented to  Procedure(s): RIGHT/LEFT HEART CATH AND CORONARY ANGIOGRAPHY (N/A) as a surgical intervention.  The patient's history has been reviewed, patient examined, no change in status, stable for surgery.  I have reviewed the patient's chart and labs.  Questions were answered to the patient's satisfaction.     Larua Collier [employer]

## 2024-03-25 NOTE — Progress Notes (Addendum)
 Progress Note   Patient: Nancy [name] FMW:[id number] DOB: 11/27/1975 DOA: 03/23/2024     2 DOS: the patient was seen and examined on 03/25/2024   Brief hospital course: 48 year old woman with PMH of recently diagnosed CHF, HTN, liver mass, tobacco use who is admitted for evaluation of a near syncopal episode occurring while undergoing exercise stress test.  Cardiology was consulted.  Assessment and Plan:  Presyncope  Occurred during stress test.  Patient had associated chest pain. Likely related to her right heart failure. - Cardiology consulted, input appreciated -Cardiac catheterization today, 8/6.  Severe right ventricular failure - acute on chronic RV failure Troponin negative x 2. BNP 6257 EKG shows chronic nonspecific ST changes. TTE done 8/5 shows severe right ventricular heart failure.  LVEF 60 to 65%.  No regional wall motion abnormalities.  Grade 1 diastolic dysfunction.  Severe right ventricular enlargement, severe right atrial enlargement. Patient with no evidence of PE or chronic respiratory disease. Cardiology consulted.  Input appreciated. Cardiology recommend further workup with RHC/LHC. - Continue Lasix  daily -RHC/LHC today.  Hypertension Blood pressure low normal. - Continue home losartan  with hold parameters.  Substance use disorder Recent urine drug screen was positive for cocaine, amphetamines. - Encourage cessation.     Subjective: Patient has no new complaints.  Reportedly tachycardic last night when she went to use the bathroom.  For cardiac catheterization today.  Physical Exam: Vitals:   03/25/24 0718 03/25/24 0900 03/25/24 1100 03/25/24 1239  BP: 109/81 116/82 106/71   Pulse: 79  85   Resp: 20  18   Temp: 98.1 F (36.7 C)  98.5 F (36.9 C)   TempSrc: Oral  Oral   SpO2:   100% 98%  Weight:      Height:       Physical Exam   General: Alert, oriented X3  Eyes: Pupils equal, reactive  Oral cavity: moist mucous membranes  Head:  Atraumatic, normocephalic  Neck: supple  Chest: clear to auscultation. No crackles, no wheezes  CVS: S1,S2 RRR. No murmurs  Abd: No distention, soft, non-tender. No masses palpable  Extr: No edema   MSK: No joint deformities or swelling  Neurological: Grossly intact.    Data Reviewed:     Latest Ref Rng & Units 03/24/2024    3:39 AM 03/23/2024    1:22 PM 02/28/2024    2:02 PM  CBC  WBC 4.0 - 10.5 K/uL 9.6  11.2  8.9   Hemoglobin 12.0 - 15.0 g/dL 84.3  83.9  84.4   Hematocrit 36.0 - 46.0 % 46.9  47.8  46.6   Platelets 150 - 400 K/uL 227  229  311       Latest Ref Rng & Units 03/25/2024    3:08 AM 03/24/2024    3:39 AM 03/23/2024    1:22 PM  BMP  Glucose 70 - 99 mg/dL 94  894  81   BUN 6 - 20 mg/dL 19  16  18    Creatinine 0.44 - 1.00 mg/dL 8.90  8.94  8.78   Sodium 135 - 145 mmol/L 137  142  141   Potassium 3.5 - 5.1 mmol/L 3.3  3.7  3.8   Chloride 98 - 111 mmol/L 99  106  102   CO2 22 - 32 mmol/L 25  26  26    Calcium  8.9 - 10.3 mg/dL 8.9  9.0  9.4      Family Communication: n/a  Disposition: Status is: Inpatient   Planned Discharge Destination:  Home DVT PPx: Lovenox  SQ     Time spent: 35 minutes  Author: MDALA-GAUSI, [name] AGATHA, MD 03/25/2024 1:33 PM  For on call review [email or web address].

## 2024-03-25 NOTE — Plan of Care (Signed)
  Problem: Nutrition: Goal: Adequate nutrition will be maintained Outcome: Completed/Met   Problem: Elimination: Goal: Will not experience complications related to urinary retention Outcome: Completed/Met   Problem: Pain Managment: Goal: General experience of comfort will improve and/or be controlled Outcome: Completed/Met   Problem: Skin Integrity: Goal: Risk for impaired skin integrity will decrease Outcome: Completed/Met

## 2024-03-25 NOTE — H&P (View-Only) (Signed)
 Advanced Heart Failure Rounding Note  Cardiologist: None  Chief Complaint: RV Failure and PH    Patient Profile   48 y/o female w/ h/o polysubstance use, including cocaine and daily meth use, tobacco abuse and pulmonary HTN,  who presented w/ worsening exertional dyspnea, CP and near syncope during exercise stress test and found to have severe RV failure due to [facility].   Subjective:    3L in UOP yesterday w/ IV Lasix . Net negative 1.8L.  SCr 1.21>>1.09, K 3.3  Slight improvement in breathing but remains volume up. No current CP.   2D Echo EF 60-65%, severe RV enlargement and severe RV dysfunction, severe RAE, LV compressed by RV, small pericardial effusion   Rheumatologic serologies pending.  CT negative for PE. V/Q not completed yet     Objective:   Weight Range: 55.4 kg Body mass index is 23.87 kg/m.   Vital Signs:   Temp:  [97.9 F (36.6 C)-98.4 F (36.9 C)] 98.1 F (36.7 C) (08/06 0718) Pulse Rate:  [79-102] 79 (08/06 0718) Resp:  [18-20] 20 (08/06 0718) BP: (100-120)/(70-81) 109/81 (08/06 0718) SpO2:  [98 %-100 %] 98 % (08/06 0322) Weight:  [55.4 kg] 55.4 kg (08/06 0450) Last BM Date : 03/22/24  Weight change: Filed Weights   03/23/24 2110 03/24/24 0537 03/25/24 0450  Weight: 56.1 kg 56.1 kg 55.4 kg    Intake/Output:   Intake/Output Summary (Last 24 hours) at 03/25/2024 0813 Last data filed at 03/25/2024 0506 Gross per 24 hour  Intake 1300 ml  Output 2850 ml  Net -1550 ml      Physical Exam    Vitals:   03/25/24 0322 03/25/24 0718  BP: 100/70 109/81  Pulse: 89 79  Resp: 20 20  Temp: 98 F (36.7 C) 98.1 F (36.7 C)  SpO2: 98%    GENERAL: fatigued appearing, NAD Lungs- clear CARDIAC:  JVP: 10-12 cm          Normal rate with regular rhythm. No murmur.  Trace b/l LE edema.  ABDOMEN: Soft, non-tender, non-distended.  EXTREMITIES: Warm and well perfused.  NEUROLOGIC: No obvious FND    Telemetry   NSR 80s, personally reviewed   EKG     NSR 88 bpm, RAD and RVH  Labs    CBC Recent Labs    03/23/24 1322 03/24/24 0339  WBC 11.2* 9.6  NEUTROABS 8.2*  --   HGB 16.0* 15.6*  HCT 47.8* 46.9*  MCV 90.2 90.5  PLT 229 227   Basic Metabolic Panel Recent Labs    91/94/74 0339 03/25/24 0308  NA 142 137  K 3.7 3.3*  CL 106 99  CO2 26 25  GLUCOSE 105* 94  BUN 16 19  CREATININE 1.05* 1.09*  CALCIUM  9.0 8.9  MG 2.2  --    Liver Function Tests Recent Labs    03/24/24 0339 03/24/24 2141  AST 33 35  ALT 44 43  ALKPHOS 64 74  BILITOT 0.9 1.0  PROT 5.9* 6.8  ALBUMIN  3.1* 3.5   No results for input(s): LIPASE, AMYLASE in the last 72 hours. Cardiac Enzymes No results for input(s): CKTOTAL, CKMB, CKMBINDEX, TROPONINI in the last 72 hours.  BNP: BNP (last 3 results) No results for input(s): BNP in the last 8760 hours.  ProBNP (last 3 results) Recent Labs    02/07/24 1809 02/28/24 1407 03/23/24 1322  PROBNP 6,349.0* 5,296.0* 6,257.0*     D-Dimer Recent Labs    03/23/24 1342  DDIMER 0.30  Hemoglobin A1C No results for input(s): HGBA1C in the last 72 hours. Fasting Lipid Panel No results for input(s): CHOL, HDL, LDLCALC, TRIG, CHOLHDL, LDLDIRECT in the last 72 hours. Thyroid Function Tests No results for input(s): TSH, T4TOTAL, T3FREE, THYROIDAB in the last 72 hours.  Invalid input(s): FREET3  Other results:   Imaging    ECHOCARDIOGRAM COMPLETE Result Date: 03/24/2024    ECHOCARDIOGRAM REPORT   Patient Name:   Nancy [name] Date of Exam: 03/24/2024 Medical Rec #:  [id number]         Height:       60.0 in Accession #:    [id number]        Weight:       123.7 lb Date of Birth:  1975/12/10        BSA:          1.522 m Patient Age:    47 years          BP:           112/85 mmHg Patient Gender: F                 HR:           97 bpm. Exam Location:  Inpatient Procedure: 2D Echo, Cardiac Doppler and Color Doppler (Both Spectral and Color            Flow Doppler  were utilized during procedure). Indications:    CHF  History:        Patient has no prior history of Echocardiogram examinations.                 CHF, Signs/Symptoms:Syncope; Risk Factors:Current Smoker and                 Hypertension.  Sonographer:    Therisa Crouch Referring Phys: [id number] VISHAL R PATEL IMPRESSIONS  1. Severe right ventricular heart failure. Patient had a CTA negative for PE 02/07/24. Consider CHF consult and right heart cath.  2. Left ventricular ejection fraction, by estimation, is 60 to 65%. The left ventricle has normal function. The left ventricle has no regional wall motion abnormalities. Left ventricular diastolic parameters are consistent with Grade I diastolic dysfunction (impaired relaxation).  3. Right ventricular systolic function is severely reduced. The right ventricular size is severely enlarged.  4. Right atrial size was severely dilated.  5. A small pericardial effusion is present. The pericardial effusion is posterior to the left ventricle.  6. The mitral valve is normal in structure. No evidence of mitral valve regurgitation. No evidence of mitral stenosis.  7. The aortic valve is tricuspid. Aortic valve regurgitation is not visualized. No aortic stenosis is present.  8. The inferior vena cava is dilated in size with >50% respiratory variability, suggesting right atrial pressure of 8 mmHg. FINDINGS  Left Ventricle: Left ventricular ejection fraction, by estimation, is 60 to 65%. The left ventricle has normal function. The left ventricle has no regional wall motion abnormalities. Strain was performed and the global longitudinal strain is indeterminate. The left ventricular internal cavity size was normal in size. There is no left ventricular hypertrophy. Left ventricular diastolic parameters are consistent with Grade I diastolic dysfunction (impaired relaxation). Right Ventricle: The right ventricular size is severely enlarged. No increase in right ventricular wall thickness.  Right ventricular systolic function is severely reduced. Left Atrium: Left atrial size was normal in size. Right At[facility]: Right atrial size was severely dilated. Pericardium: A small pericardial effusion is present. The pericardial  effusion is posterior to the left ventricle. Mitral Valve: The mitral valve is normal in structure. No evidence of mitral valve regurgitation. No evidence of mitral valve stenosis. Tricuspid Valve: The tricuspid valve is normal in structure. Tricuspid valve regurgitation is mild . No evidence of tricuspid stenosis. Aortic Valve: The aortic valve is tricuspid. Aortic valve regurgitation is not visualized. No aortic stenosis is present. Aortic valve mean gradient measures 2.0 mmHg. Aortic valve peak gradient measures 3.1 mmHg. Aortic valve area, by VTI measures 2.53 cm. Pulmonic Valve: The pulmonic valve was normal in structure. Pulmonic valve regurgitation is not visualized. No evidence of pulmonic stenosis. Aorta: The aortic root is normal in size and structure. Venous: The inferior vena cava is dilated in size with greater than 50% respiratory variability, suggesting right atrial pressure of 8 mmHg. IAS/Shunts: No atrial level shunt detected by color flow Doppler. Additional Comments: Severe right ventricular heart failure. Patient had a CTA negative for PE 02/07/24. Consider CHF consult and right heart cath. 3D was performed not requiring image post processing on an independent workstation and was indeterminate.  LEFT VENTRICLE PLAX 2D LVIDd:         3.60 cm   Diastology LVIDs:         1.80 cm   LV e' medial:  4.46 cm/s LV PW:         1.10 cm   LV e' lateral: 6.64 cm/s LV IVS:        1.10 cm LVOT diam:     2.00 cm LV SV:         34 LV SV Index:   22 LVOT Area:     3.14 cm  RIGHT VENTRICLE            IVC RV Basal diam:  4.60 cm    IVC diam: 2.40 cm RV Mid diam:    4.60 cm RV S prime:     6.64 cm/s TAPSE (M-mode): 1.2 cm LEFT ATRIUM         Index       RIGHT ATRIUM           Index LA  diam:    2.40 cm 1.58 cm/m  RA Area:     23.20 cm                                 RA Volume:   77.60 ml  50.99 ml/m  AORTIC VALVE AV Area (Vmax):    2.48 cm AV Area (Vmean):   2.33 cm AV Area (VTI):     2.53 cm AV Vmax:           87.50 cm/s AV Vmean:          67.200 cm/s AV VTI:            0.134 m AV Peak Grad:      3.1 mmHg AV Mean Grad:      2.0 mmHg LVOT Vmax:         69.10 cm/s LVOT Vmean:        49.900 cm/s LVOT VTI:          0.108 m LVOT/AV VTI ratio: 0.81  AORTA Ao Root diam: 3.20 cm Ao Asc diam:  2.60 cm TRICUSPID VALVE TR Peak grad:   70.2 mmHg TR Vmax:        419.00 cm/s  SHUNTS Systemic VTI:  0.11 m Systemic Diam: 2.00 cm  Maude Emmer MD Electronically signed by Maude Emmer MD Signature Date/Time: 03/24/2024/2:41:15 PM    Final      Medications:     Scheduled Medications:  aspirin   81 mg Oral Daily   enoxaparin  (LOVENOX ) injection  40 mg Subcutaneous Q24H   losartan   50 mg Oral Daily   potassium chloride   40 mEq Oral Q4H   sodium chloride  flush  3 mL Intravenous Q12H    Infusions:   PRN Medications: acetaminophen  **OR** acetaminophen , calcium  carbonate, senna-docusate     Assessment/Plan   1. RV failure: Echo this admission with EF 60-65%, severe RV enlargement and severe RV dysfunction, severe RAE, LV compressed by RV, small pericardial effusion.  ECG shows RVH.  I strongly suspect severe pulmonary hypertension though PA pressures could not be estimated from the echo.  She has had symptoms for about 2 months, getting quite severe with exertional dyspnea and chest pain as well as presyncope.  She has had 2 CTA chest studies in the last 2 months with no PE and minimal lung parenchymal disease.  No known rheumatologic disorder.  No FH of pulmonary hypertension/RV failure.  She uses amphetamines regularly and also cocaine, both can be causes of pulmonary hypertension. She has a liver mass by CT that has been biopsied but not cirrhosis, doubt portopulmonary hypertension.  Diuresing w/ IV Lasix  but remains mildly volume overloaded on exam today.  - Repeat IV Lasix  80 mg x 1  - Eventual SGLT2 inhibitor/spironolactone.  - Plan RHC/LHC today.  Need to assess pulmonary artery pressure/filling pressures, also will assess coronaries with exertional chest pain and long-standing smoking history.  Discussed risks/benefits and she agrees to procedure.  - V/Q scan ordered to rule out chronic PE.  - Rheumatologic serologies, sent. Results pending  - She will need to stop amphetamines/cocaine.  2. Liver lesion: Patient was found incidentally by CT to have liver mass but not cirrhosis.   - S/p liver biopsy, report not available yet.  3. Smoking:  encouraged her to quit.  4. Hypokalemia: K 3.3 w/ diuresis - supp w/ KCl. D/w pharmD   Length of Stay: 2  Caffie Shed, PA-C  03/25/2024, 8:13 AM  Advanced Heart Failure Team Pager [phone] (M-F; 7a - 5p)  Please contact [facility] for night-coverage after hours (5p -7a ) and weekends on [email or web address]  Patient seen with PA, I formulated the plan and agree with the above note.   I/Os net negative 1750 with IV Lasix  last night.  No dyspnea at rest.  BP stable .  General: NAD Neck: JVP 8-9 cm, no thyromegaly or thyroid nodule.  Lungs: Clear to auscultation bilaterally with normal respiratory effort. CV: Nondisplaced PMI.  Heart regular S1/S2, no S3/S4, no murmur.  No peripheral edema.   Abdomen: Soft, nontender, no hepatosplenomegaly, no distention.  Skin: Intact without lesions or rashes.  Neurologic: Alert and oriented x 3.  Psych: Normal affect. Extremities: No clubbing or cyanosis.  HEENT: Normal.   Patient still appears at least mildly volume overloaded in setting of RV failure.  Will give Lasix  80 mg IV x 1 again.   She will have RHC/LHC today for pulmonary hypertension evaluation and also coronary evaluation in setting of exertional chest pain.  I suspect that we are going to find severe pulmonary  hypertension, possibly related to amphetamine/cocaine use.  She will be getting V/Q scan to rule out chronic PE as well as rheumatological serologic workup.  I discussed risks/benefits of cath and she agrees to  procedure.   Ezra Shuck 03/25/2024 8:52 AM

## 2024-03-25 NOTE — Plan of Care (Signed)
  Problem: Education: Goal: Knowledge of General Education information will improve Description: Including pain rating scale, medication(s)/side effects and non-pharmacologic comfort measures Outcome: Progressing   Problem: Clinical Measurements: Goal: Respiratory complications will improve Outcome: Progressing   Problem: Clinical Measurements: Goal: Cardiovascular complication will be avoided Outcome: Progressing   Problem: Coping: Goal: Level of anxiety will decrease Outcome: Progressing   

## 2024-03-25 NOTE — Progress Notes (Signed)
 Site area: R groin (arterial) Site Prior to Removal:  Level 0 Pressure Applied For: 25 minutes Manual:   yes Patient Status During Pull:  stable Post Pull Site:  Level 0 Post Pull Instructions Given:  yes Post Pull Pulses Present: +1 DP and +2 PT Dressing Applied:  gauze & tegaderm Bedrest begins @ 1435 Comments:

## 2024-03-25 NOTE — Telephone Encounter (Signed)
 Patient Advocate Encounter   Received notification from [facility] that prior authorization for Tadalafil  PAH is required.   PA submitted on CoverMyMeds Key [id number] Status is pending   Will continue to follow.

## 2024-03-25 NOTE — Telephone Encounter (Signed)
 Patient Advocate Encounter   Received notification from [facility] that prior authorization for Opsumit is required.   PA submitted on CoverMyMeds Key [id number] Status is pending   Will continue to follow.

## 2024-03-25 NOTE — Progress Notes (Signed)
 Heart Failure Navigator Progress Note  Assessed for Heart & Vascular TOC clinic readiness.  Patient does not meet criteria due to Advanced Heart Failure Team consulted. .   Navigator will sign off at this time.   Rhae Hammock, BSN, Scientist, clinical (histocompatibility and immunogenetics) Only

## 2024-03-25 NOTE — Progress Notes (Addendum)
 Advanced Heart Failure Rounding Note  Cardiologist: None  Chief Complaint: RV Failure and PH    Patient Profile   48 y/o female w/ h/o polysubstance use, including cocaine and daily meth use, tobacco abuse and pulmonary HTN,  who presented w/ worsening exertional dyspnea, CP and near syncope during exercise stress test and found to have severe RV failure due to [facility].   Subjective:    3L in UOP yesterday w/ IV Lasix . Net negative 1.8L.  SCr 1.21>>1.09, K 3.3  Slight improvement in breathing but remains volume up. No current CP.   2D Echo EF 60-65%, severe RV enlargement and severe RV dysfunction, severe RAE, LV compressed by RV, small pericardial effusion   Rheumatologic serologies pending.  CT negative for PE. V/Q not completed yet     Objective:   Weight Range: 55.4 kg Body mass index is 23.87 kg/m.   Vital Signs:   Temp:  [97.9 F (36.6 C)-98.4 F (36.9 C)] 98.1 F (36.7 C) (08/06 0718) Pulse Rate:  [79-102] 79 (08/06 0718) Resp:  [18-20] 20 (08/06 0718) BP: (100-120)/(70-81) 109/81 (08/06 0718) SpO2:  [98 %-100 %] 98 % (08/06 0322) Weight:  [55.4 kg] 55.4 kg (08/06 0450) Last BM Date : 03/22/24  Weight change: Filed Weights   03/23/24 2110 03/24/24 0537 03/25/24 0450  Weight: 56.1 kg 56.1 kg 55.4 kg    Intake/Output:   Intake/Output Summary (Last 24 hours) at 03/25/2024 0813 Last data filed at 03/25/2024 0506 Gross per 24 hour  Intake 1300 ml  Output 2850 ml  Net -1550 ml      Physical Exam    Vitals:   03/25/24 0322 03/25/24 0718  BP: 100/70 109/81  Pulse: 89 79  Resp: 20 20  Temp: 98 F (36.7 C) 98.1 F (36.7 C)  SpO2: 98%    GENERAL: fatigued appearing, NAD Lungs- clear CARDIAC:  JVP: 10-12 cm          Normal rate with regular rhythm. No murmur.  Trace b/l LE edema.  ABDOMEN: Soft, non-tender, non-distended.  EXTREMITIES: Warm and well perfused.  NEUROLOGIC: No obvious FND    Telemetry   NSR 80s, personally reviewed   EKG     NSR 88 bpm, RAD and RVH  Labs    CBC Recent Labs    03/23/24 1322 03/24/24 0339  WBC 11.2* 9.6  NEUTROABS 8.2*  --   HGB 16.0* 15.6*  HCT 47.8* 46.9*  MCV 90.2 90.5  PLT 229 227   Basic Metabolic Panel Recent Labs    91/94/74 0339 03/25/24 0308  NA 142 137  K 3.7 3.3*  CL 106 99  CO2 26 25  GLUCOSE 105* 94  BUN 16 19  CREATININE 1.05* 1.09*  CALCIUM  9.0 8.9  MG 2.2  --    Liver Function Tests Recent Labs    03/24/24 0339 03/24/24 2141  AST 33 35  ALT 44 43  ALKPHOS 64 74  BILITOT 0.9 1.0  PROT 5.9* 6.8  ALBUMIN  3.1* 3.5   No results for input(s): LIPASE, AMYLASE in the last 72 hours. Cardiac Enzymes No results for input(s): CKTOTAL, CKMB, CKMBINDEX, TROPONINI in the last 72 hours.  BNP: BNP (last 3 results) No results for input(s): BNP in the last 8760 hours.  ProBNP (last 3 results) Recent Labs    02/07/24 1809 02/28/24 1407 03/23/24 1322  PROBNP 6,349.0* 5,296.0* 6,257.0*     D-Dimer Recent Labs    03/23/24 1342  DDIMER 0.30  Hemoglobin A1C No results for input(s): HGBA1C in the last 72 hours. Fasting Lipid Panel No results for input(s): CHOL, HDL, LDLCALC, TRIG, CHOLHDL, LDLDIRECT in the last 72 hours. Thyroid Function Tests No results for input(s): TSH, T4TOTAL, T3FREE, THYROIDAB in the last 72 hours.  Invalid input(s): FREET3  Other results:   Imaging    ECHOCARDIOGRAM COMPLETE Result Date: 03/24/2024    ECHOCARDIOGRAM REPORT   Patient Name:   Nancy [name] Date of Exam: 03/24/2024 Medical Rec #:  [id number]         Height:       60.0 in Accession #:    [id number]        Weight:       123.7 lb Date of Birth:  1975/12/10        BSA:          1.522 m Patient Age:    47 years          BP:           112/85 mmHg Patient Gender: F                 HR:           97 bpm. Exam Location:  Inpatient Procedure: 2D Echo, Cardiac Doppler and Color Doppler (Both Spectral and Color            Flow Doppler  were utilized during procedure). Indications:    CHF  History:        Patient has no prior history of Echocardiogram examinations.                 CHF, Signs/Symptoms:Syncope; Risk Factors:Current Smoker and                 Hypertension.  Sonographer:    Therisa Crouch Referring Phys: [id number] VISHAL R PATEL IMPRESSIONS  1. Severe right ventricular heart failure. Patient had a CTA negative for PE 02/07/24. Consider CHF consult and right heart cath.  2. Left ventricular ejection fraction, by estimation, is 60 to 65%. The left ventricle has normal function. The left ventricle has no regional wall motion abnormalities. Left ventricular diastolic parameters are consistent with Grade I diastolic dysfunction (impaired relaxation).  3. Right ventricular systolic function is severely reduced. The right ventricular size is severely enlarged.  4. Right atrial size was severely dilated.  5. A small pericardial effusion is present. The pericardial effusion is posterior to the left ventricle.  6. The mitral valve is normal in structure. No evidence of mitral valve regurgitation. No evidence of mitral stenosis.  7. The aortic valve is tricuspid. Aortic valve regurgitation is not visualized. No aortic stenosis is present.  8. The inferior vena cava is dilated in size with >50% respiratory variability, suggesting right atrial pressure of 8 mmHg. FINDINGS  Left Ventricle: Left ventricular ejection fraction, by estimation, is 60 to 65%. The left ventricle has normal function. The left ventricle has no regional wall motion abnormalities. Strain was performed and the global longitudinal strain is indeterminate. The left ventricular internal cavity size was normal in size. There is no left ventricular hypertrophy. Left ventricular diastolic parameters are consistent with Grade I diastolic dysfunction (impaired relaxation). Right Ventricle: The right ventricular size is severely enlarged. No increase in right ventricular wall thickness.  Right ventricular systolic function is severely reduced. Left Atrium: Left atrial size was normal in size. Right Atrium: Right atrial size was severely dilated. Pericardium: A small pericardial effusion is present. The pericardial  effusion is posterior to the left ventricle. Mitral Valve: The mitral valve is normal in structure. No evidence of mitral valve regurgitation. No evidence of mitral valve stenosis. Tricuspid Valve: The tricuspid valve is normal in structure. Tricuspid valve regurgitation is mild . No evidence of tricuspid stenosis. Aortic Valve: The aortic valve is tricuspid. Aortic valve regurgitation is not visualized. No aortic stenosis is present. Aortic valve mean gradient measures 2.0 mmHg. Aortic valve peak gradient measures 3.1 mmHg. Aortic valve area, by VTI measures 2.53 cm. Pulmonic Valve: The pulmonic valve was normal in structure. Pulmonic valve regurgitation is not visualized. No evidence of pulmonic stenosis. Aorta: The aortic root is normal in size and structure. Venous: The inferior vena cava is dilated in size with greater than 50% respiratory variability, suggesting right atrial pressure of 8 mmHg. IAS/Shunts: No atrial level shunt detected by color flow Doppler. Additional Comments: Severe right ventricular heart failure. Patient had a CTA negative for PE 02/07/24. Consider CHF consult and right heart cath. 3D was performed not requiring image post processing on an independent workstation and was indeterminate.  LEFT VENTRICLE PLAX 2D LVIDd:         3.60 cm   Diastology LVIDs:         1.80 cm   LV e' medial:  4.46 cm/s LV PW:         1.10 cm   LV e' lateral: 6.64 cm/s LV IVS:        1.10 cm LVOT diam:     2.00 cm LV SV:         34 LV SV Index:   22 LVOT Area:     3.14 cm  RIGHT VENTRICLE            IVC RV Basal diam:  4.60 cm    IVC diam: 2.40 cm RV Mid diam:    4.60 cm RV S prime:     6.64 cm/s TAPSE (M-mode): 1.2 cm LEFT ATRIUM         Index       RIGHT ATRIUM           Index LA  diam:    2.40 cm 1.58 cm/m  RA Area:     23.20 cm                                 RA Volume:   77.60 ml  50.99 ml/m  AORTIC VALVE AV Area (Vmax):    2.48 cm AV Area (Vmean):   2.33 cm AV Area (VTI):     2.53 cm AV Vmax:           87.50 cm/s AV Vmean:          67.200 cm/s AV VTI:            0.134 m AV Peak Grad:      3.1 mmHg AV Mean Grad:      2.0 mmHg LVOT Vmax:         69.10 cm/s LVOT Vmean:        49.900 cm/s LVOT VTI:          0.108 m LVOT/AV VTI ratio: 0.81  AORTA Ao Root diam: 3.20 cm Ao Asc diam:  2.60 cm TRICUSPID VALVE TR Peak grad:   70.2 mmHg TR Vmax:        419.00 cm/s  SHUNTS Systemic VTI:  0.11 m Systemic Diam: 2.00 cm  Maude Emmer MD Electronically signed by Maude Emmer MD Signature Date/Time: 03/24/2024/2:41:15 PM    Final      Medications:     Scheduled Medications:  aspirin   81 mg Oral Daily   enoxaparin  (LOVENOX ) injection  40 mg Subcutaneous Q24H   losartan   50 mg Oral Daily   potassium chloride   40 mEq Oral Q4H   sodium chloride  flush  3 mL Intravenous Q12H    Infusions:   PRN Medications: acetaminophen  **OR** acetaminophen , calcium  carbonate, senna-docusate     Assessment/Plan   1. RV failure: Echo this admission with EF 60-65%, severe RV enlargement and severe RV dysfunction, severe RAE, LV compressed by RV, small pericardial effusion.  ECG shows RVH.  I strongly suspect severe pulmonary hypertension though PA pressures could not be estimated from the echo.  She has had symptoms for about 2 months, getting quite severe with exertional dyspnea and chest pain as well as presyncope.  She has had 2 CTA chest studies in the last 2 months with no PE and minimal lung parenchymal disease.  No known rheumatologic disorder.  No FH of pulmonary hypertension/RV failure.  She uses amphetamines regularly and also cocaine, both can be causes of pulmonary hypertension. She has a liver mass by CT that has been biopsied but not cirrhosis, doubt portopulmonary hypertension.  Diuresing w/ IV Lasix  but remains mildly volume overloaded on exam today.  - Repeat IV Lasix  80 mg x 1  - Eventual SGLT2 inhibitor/spironolactone.  - Plan RHC/LHC today.  Need to assess pulmonary artery pressure/filling pressures, also will assess coronaries with exertional chest pain and long-standing smoking history.  Discussed risks/benefits and she agrees to procedure.  - V/Q scan ordered to rule out chronic PE.  - Rheumatologic serologies, sent. Results pending  - She will need to stop amphetamines/cocaine.  2. Liver lesion: Patient was found incidentally by CT to have liver mass but not cirrhosis.   - S/p liver biopsy, report not available yet.  3. Smoking:  encouraged her to quit.  4. Hypokalemia: K 3.3 w/ diuresis - supp w/ KCl. D/w pharmD   Length of Stay: 2  Caffie Shed, PA-C  03/25/2024, 8:13 AM  Advanced Heart Failure Team Pager [phone] (M-F; 7a - 5p)  Please contact [facility] for night-coverage after hours (5p -7a ) and weekends on [email or web address]  Patient seen with PA, I formulated the plan and agree with the above note.   I/Os net negative 1750 with IV Lasix  last night.  No dyspnea at rest.  BP stable .  General: NAD Neck: JVP 8-9 cm, no thyromegaly or thyroid nodule.  Lungs: Clear to auscultation bilaterally with normal respiratory effort. CV: Nondisplaced PMI.  Heart regular S1/S2, no S3/S4, no murmur.  No peripheral edema.   Abdomen: Soft, nontender, no hepatosplenomegaly, no distention.  Skin: Intact without lesions or rashes.  Neurologic: Alert and oriented x 3.  Psych: Normal affect. Extremities: No clubbing or cyanosis.  HEENT: Normal.   Patient still appears at least mildly volume overloaded in setting of RV failure.  Will give Lasix  80 mg IV x 1 again.   She will have RHC/LHC today for pulmonary hypertension evaluation and also coronary evaluation in setting of exertional chest pain.  I suspect that we are going to find severe pulmonary  hypertension, possibly related to amphetamine/cocaine use.  She will be getting V/Q scan to rule out chronic PE as well as rheumatological serologic workup.  I discussed risks/benefits of cath and she agrees to  procedure.   Ezra Shuck 03/25/2024 8:52 AM

## 2024-03-25 NOTE — Telephone Encounter (Signed)
Patient Product/process development scientist completed.    The patient is insured through [facility]. Patient has [employer], may use a copay card, and/or apply for patient assistance if available.    Ran test claim for Entresto 24-26 mg and the current 30 day co-pay is $0.00.  Ran test claim for Farxiga 10 mg and the current 30 day co-pay is $0.00.  Ran test claim for Jardiance 10 mg and the current 30 day co-pay is $0.00.   This test claim was processed through [facility]- copay amounts may vary at other pharmacies due to pharmacy/plan contracts, or as the patient moves through the different stages of their insurance plan.     Roland Earl, CPHT Pharmacy Technician III Certified Patient Advocate [facility] Patient Advocate Team Direct Number: [phone]  Fax: [phone]

## 2024-03-25 NOTE — Progress Notes (Signed)
 Mobility Specialist Progress Note:    03/25/24 0952  Mobility  Activity Ambulated independently  Level of Assistance Independent  Assistive Device None  Distance Ambulated (ft) 500 ft  Activity Response Tolerated well  Mobility Referral Yes  Mobility visit 1 Mobility  Mobility Specialist Start Time (ACUTE ONLY) [id number]  Mobility Specialist Stop Time (ACUTE ONLY) 1004  Mobility Specialist Time Calculation (min) (ACUTE ONLY) 12 min   Pt pleasant and agreeable to session. No c/o any symptoms. Pt able to move and ambulate w/o assistance. Left pt in bed w/ all needs met.   Venetia Keel Mobility Specialist Please Neurosurgeon or Rehab Office at [phone]

## 2024-03-26 ENCOUNTER — Other Ambulatory Visit (HOSPITAL_COMMUNITY): Payer: Self-pay

## 2024-03-26 ENCOUNTER — Encounter (HOSPITAL_COMMUNITY): Payer: Self-pay | Admitting: Internal Medicine

## 2024-03-26 ENCOUNTER — Inpatient Hospital Stay (HOSPITAL_COMMUNITY)

## 2024-03-26 DIAGNOSIS — Z720 Tobacco use: Secondary | ICD-10-CM

## 2024-03-26 DIAGNOSIS — R160 Hepatomegaly, not elsewhere classified: Secondary | ICD-10-CM

## 2024-03-26 DIAGNOSIS — I10 Essential (primary) hypertension: Secondary | ICD-10-CM

## 2024-03-26 DIAGNOSIS — I50811 Acute right heart failure: Secondary | ICD-10-CM

## 2024-03-26 LAB — POCT I-STAT EG7
Acid-Base Excess: 4 mmol/L — ABNORMAL HIGH (ref 0.0–2.0)
Bicarbonate: 30.4 mmol/L — ABNORMAL HIGH (ref 20.0–28.0)
Calcium, Ion: 1.18 mmol/L (ref 1.15–1.40)
HCT: 51 % — ABNORMAL HIGH (ref 36.0–46.0)
Hemoglobin: 17.3 g/dL — ABNORMAL HIGH (ref 12.0–15.0)
O2 Saturation: 59 %
Potassium: 3.7 mmol/L (ref 3.5–5.1)
Sodium: 139 mmol/L (ref 135–145)
TCO2: 32 mmol/L (ref 22–32)
pCO2, Ven: 50.6 mmHg (ref 44–60)
pH, Ven: 7.387 (ref 7.25–7.43)
pO2, Ven: 32 mmHg (ref 32–45)

## 2024-03-26 LAB — ANCA PROFILE
Anti-MPO Antibodies: <0.2 U (ref 0.0–0.9)
Anti-PR3 Antibodies: <0.2 U (ref 0.0–0.9)
Atypical P-ANCA titer: <1:20 {titer}
C-ANCA: <1:20 {titer}
P-ANCA: <1:20 {titer}

## 2024-03-26 LAB — LIPOPROTEIN A (LPA): Lipoprotein (a): 53.8 nmol/L — ABNORMAL HIGH (ref <75.0)

## 2024-03-26 LAB — BASIC METABOLIC PANEL WITH GFR
Anion gap: 9 (ref 5–15)
BUN: 20 mg/dL (ref 6–20)
CO2: 23 mmol/L (ref 22–32)
Calcium: 9 mg/dL (ref 8.9–10.3)
Chloride: 105 mmol/L (ref 98–111)
Creatinine, Ser: 1.1 mg/dL — ABNORMAL HIGH (ref 0.44–1.00)
GFR, Estimated: >60 mL/min (ref >60)
Glucose, Bld: 87 mg/dL (ref 70–99)
Potassium: 4.7 mmol/L (ref 3.5–5.1)
Sodium: 137 mmol/L (ref 135–145)

## 2024-03-26 LAB — HEMOGLOBIN A1C
Hgb A1c MFr Bld: 6.5 % — ABNORMAL HIGH (ref 4.8–5.6)
Mean Plasma Glucose: 140 mg/dL

## 2024-03-26 LAB — ANA: Anti Nuclear Antibody (ANA): NEGATIVE

## 2024-03-26 LAB — ANTI-SCLERODERMA ANTIBODY: Scleroderma (Scl-70) (ENA) Antibody, IgG: <0.2 AI (ref 0.0–0.9)

## 2024-03-26 MED ORDER — NICOTINE 14 MG/24HR TD PT24
14.0000 mg | MEDICATED_PATCH | Freq: Every day | TRANSDERMAL | Status: DC
  Filled 2024-03-26: qty 1

## 2024-03-26 MED ORDER — MIDODRINE HCL 5 MG PO TABS
10.0000 mg | ORAL_TABLET | Freq: Three times a day (TID) | ORAL | Status: DC
  Administered 2024-03-26 – 2024-03-27 (×2): 10 mg via ORAL
  Filled 2024-03-26 (×2): qty 2

## 2024-03-26 MED ORDER — TADALAFIL (PAH) 20 MG PO TABS
40.0000 mg | ORAL_TABLET | Freq: Every day | ORAL | 0 refills | Status: DC
  Filled 2024-03-26: qty 10, 5d supply, fill #0

## 2024-03-26 MED ORDER — TECHNETIUM TO 99M ALBUMIN AGGREGATED
4.4000 | Freq: Once | INTRAVENOUS | Status: AC
  Administered 2024-03-26: 4.4 via INTRAVENOUS

## 2024-03-26 MED ORDER — MIDODRINE HCL 10 MG PO TABS
10.0000 mg | ORAL_TABLET | Freq: Three times a day (TID) | ORAL | 0 refills | Status: DC
  Filled 2024-03-26: qty 90, 30d supply, fill #0

## 2024-03-26 MED ORDER — MIDODRINE HCL 5 MG PO TABS
5.0000 mg | ORAL_TABLET | Freq: Three times a day (TID) | ORAL | Status: DC
  Administered 2024-03-26 (×2): 5 mg via ORAL
  Filled 2024-03-26 (×2): qty 1

## 2024-03-26 MED ORDER — DIGOXIN 125 MCG PO TABS
0.1250 mg | ORAL_TABLET | Freq: Every day | ORAL | 0 refills | Status: DC
  Filled 2024-03-26: qty 30, 30d supply, fill #0

## 2024-03-26 MED ORDER — MIDODRINE HCL 5 MG PO TABS
5.0000 mg | ORAL_TABLET | Freq: Once | ORAL | Status: AC
  Administered 2024-03-26: 5 mg via ORAL
  Filled 2024-03-26: qty 1

## 2024-03-26 MED ORDER — TADALAFIL 20 MG PO TABS
40.0000 mg | ORAL_TABLET | Freq: Every day | ORAL | Status: DC
  Administered 2024-03-26 – 2024-03-27 (×2): 40 mg via ORAL
  Filled 2024-03-26 (×2): qty 2

## 2024-03-26 MED FILL — Verapamil HCl IV Soln 2.5 MG/ML: INTRAVENOUS | Qty: 2 | Status: AC

## 2024-03-26 NOTE — Discharge Summary (Addendum)
 Physician Discharge Summary   Patient: Nancy [name] MRN: [id number] DOB: 07-05-1976  Admit date:     03/23/2024  Discharge date: 03/27/24  Discharge Physician: Elidia Sieving Brentlee Delage   PCP: Melba Lamarr BRAVO, NP   Recommendations at discharge:    Patient has been placed on midodrine  for blood pressure support Tadalafi for pulmonary hypertension  As needed furosemide  for volume overload, weight gain 2 to 3 lbs in 24 hrs or 5 lbs in 7 days.  Follow up with primary care  Katheryn Fuller NP in 7 to 10 days Follow up with Cardiology as scheduled.   Discharge Diagnoses: Principal Problem:   Congestive heart failure with unknown left ventricular ejection fraction (HCC) Active Problems:   Near syncope   Essential hypertension   Liver mass   Tobacco use   Acute right-sided heart failure (HCC)  Resolved Problems:   * No resolved hospital problems. [facility] Course: Nancy [name] was admitted to the hospital with the working diagnosis of near syncope.   48 y.o. female with medical history significant for heart failure, hypertension, liver mass, tobacco who presented after having a near syncope during a exercise stress test. Patient was undergoing treadmill exercise testing for chest pain work up. While running she developed dizziness and light headedness, that prompted ending the test and she was referred to the ED. No loss of consciousness.  On her initial physical examination her blood pressure was 102/87, HR 108, RR 18 and 02 saturation 99% on room air,  Lungs with no wheezing or rhonchi, heart with S1 and S2 present and regular, abdomen with no distention and no lower extremity edema.   Na 141, K 3.8 Cl 102 bicarbonate 26, glucose 81, bun 18 cr 1,21 AST 49 and ALT 62  BNP 6,257  High sensitive troponin 16 and < 15  Wbc 11,2 hgb 16,0 plt 229  D dimer 0,30   01/2024 toxicology screen positive for amphetamines and cocaine.   Chest radiograph with mild cardiomegaly with no  infiltrates or effusions.   01/2024 CT angiography, negative for acute pulmonary embolus or aortic dissection.  Enlarged pulmonary trunk.  Heterogenous liver enhancement, suspect due to passive congestion. Indeterminate 2,9 cm enhancing liver mass. (Recommended outpatient abdominal MRI).  Prominent endometrial thickening measuring up to 2,8 cm, recommended follow up with pelvic ultrasound.   EKG 107 bpm, right axis deviation, qtc 465, qrs not prolonged but with right bundle branch block morphology, sinus rhythm with right atrial enlargement, no significant ST segment changes, negative T wave II, III, aVF, V4 to V6.   Echocardiogram with severe RV systolic failure.  Patient was placed on digoxin , midodrine  and tadalafil .  08/06 right heart catheterization with moderate to severe pulmonary arterial hypertension  Will need close follow up as outpatient.   Assessment and Plan: * Acute right-sided heart failure (HCC) Pulmonary hypertension, likely group 1 or group 5   Echocardiogram with preserved LV systolic function with EF 60 to 65%, RV systolic function with severe reduction, severe RV enlargement, RA with severe dilatation, LA normal size, small pericardial effusion, no significant valvular disease.   08/06 cardiac catheterization (left with normal coronaries)   RA mean 6 RV 67/89 PA 65/40 mean 49  PCWP 2 PVR 18.8  Cardiac output 2,49 and index 1,65 (Fick)   Ana negative Scleroderma ab <0.2   Pulmonary V/Q scan low probability for pulmonary embolism. No segmental peripheral perfusion defects to suggest pulmonary embolism.   Patient was placed on furosemide  for diuresis,  negative fluid balance was achieved, -2.133 ml with improvement of her symptoms.   Plan to continue midodrine  for blood pressure support. Tadalafil  and digoxin .  As needed furosemide  for signs of volume overload.  Working on starting mecitentan as outpatient.  Patient may need prostacyclin agonist infusion, to  start as outpatient.  Advised to stop amphetamines and cocaine.   Essential hypertension Hypotension, due to RV failure.   Patient has been placed on midodrine  for blood pressure support.  Will need close follow up as outpatient   Liver mass 08/01 sp biopsy at [facility], pending results.   Tobacco use Smoking cessation counseling.   Consultants: cardiology  Procedures performed:  heart catheterization   Disposition: Home Diet recommendation:  Cardiac diet DISCHARGE MEDICATION: Allergies as of 03/27/2024   No Known Allergies      Medication List     STOP taking these medications    losartan  50 MG tablet Commonly known as: COZAAR    potassium chloride  10 MEQ CR capsule Commonly known as: MICRO-K        TAKE these medications    digoxin  0.125 MG tablet Commonly known as: LANOXIN  Take 1 tablet (0.125 mg total) by mouth daily.   EPINEPHrine  0.3 mg/0.3 mL Soaj injection Commonly known as: EPI-PEN Inject 0.3 mg into the muscle as needed for anaphylaxis.   furosemide  20 MG tablet Commonly known as: LASIX  Take 1 tablet (20 mg total) by mouth daily as needed for edema or fluid (leg swelling, weight gain 2 to 3 lbs in 24 hrs or 5 lbs in 7 days.). What changed:  when to take this reasons to take this   midodrine  10 MG tablet Commonly known as: PROAMATINE  Take 1 tablet (10 mg total) by mouth 3 (three) times daily with meals.   potassium chloride  10 MEQ tablet Commonly known as: KLOR-CON  M Take 1 tablet (10 mEq total) by mouth daily as needed (take when taking furosemide ).   tadalafil  (PAH) 20 MG tablet Commonly known as: ADCIRCA  Take 2 tablets (40 mg total) by mouth daily.        Follow-up Information     [facility] Follow up.   Specialty: Cardiology Why: 04/03/24 at 10:30 AM   Hospital follow up in the Advanced Heart Failure Clinc at [facility], [place] [facility] [place] Contact  information: [address] [place]  [address] [phone]               Discharge Exam: Fredricka Weights   03/24/24 0537 03/25/24 0450 03/26/24 0441  Weight: 56.1 kg 55.4 kg 55.2 kg   BP (!) 86/59 (BP Location: Left Arm)   Pulse 97   Temp 98.2 F (36.8 C) (Oral)   Resp 18   Ht 5' (1.524 m)   Wt 55.2 kg   SpO2 98%   BMI 23.75 kg/m   Patient is feeling well, she has been ambulating with no dizziness or lightheadedness, no syncope, chest pain or dyspnea, no PND or lower extremity edema.   Neurology awake and alert ENT with mild pallor with no icterus Cardiovascular with S1 and S2 present and regular with no gallops or rubs Respiratory with no rales or wheezing Abdomen with no distention  No  lower extremity edema   Condition at discharge: stable  The results of significant diagnostics from this hospitalization (including imaging, microbiology, ancillary and laboratory) are listed below for reference.   Imaging Studies: CARDIAC CATHETERIZATION Result Date: 03/25/2024 1.  Low left heart filling pressures. 2. Normal RV filling pressure. 3. Moderate to severe pulmonary arterial hypertension. 4. Low cardiac output. 5. Normal coronaries.   DG Chest 2 View Result Date: 03/25/2024 CLINICAL DATA:  Dyspnea.  Chest pain. EXAM: CHEST - 2 VIEW COMPARISON:  03/23/2024. FINDINGS: The heart size and mediastinal contours are unchanged. No overt pulmonary edema, focal consolidation, pleural effusion, or pneumothorax. No acute osseous abnormality. IMPRESSION: No acute cardiopulmonary findings. Electronically Signed   By: Harrietta Sherry M.D.   On: 03/25/2024 12:32   ECHOCARDIOGRAM COMPLETE Result Date: 03/24/2024    ECHOCARDIOGRAM REPORT   Patient Name:   RANITA STJULIEN Date of Exam: 03/24/2024 Medical Rec #:  [id number]         Height:       60.0 in Accession #:    [id number]        Weight:       123.7 lb Date of Birth:  [birth year]        BSA:          1.522 m Patient  Age:    47 years          BP:           112/85 mmHg Patient Gender: F                 HR:           97 bpm. Exam Location:  Inpatient Procedure: 2D Echo, Cardiac Doppler and Color Doppler (Both Spectral and Color            Flow Doppler were utilized during procedure). Indications:    CHF  History:        Patient has no prior history of Echocardiogram examinations.                 CHF, Signs/Symptoms:Syncope; Risk Factors:Current Smoker and                 Hypertension.  Sonographer:    Therisa Crouch Referring Phys: [id number] VISHAL R PATEL IMPRESSIONS  1. Severe right ventricular heart failure. Patient had a CTA negative for PE 02/07/24. Consider CHF consult and right heart cath.  2. Left ventricular ejection fraction, by estimation, is 60 to 65%. The left ventricle has normal function. The left ventricle has no regional wall motion abnormalities. Left ventricular diastolic parameters are consistent with Grade I diastolic dysfunction (impaired relaxation).  3. Right ventricular systolic function is severely reduced. The right ventricular size is severely enlarged.  4. Right atrial size was severely dilated.  5. A small pericardial effusion is present. The pericardial effusion is posterior to the left ventricle.  6. The mitral valve is normal in structure. No evidence of mitral valve regurgitation. No evidence of mitral stenosis.  7. The aortic valve is tricuspid. Aortic valve regurgitation is not visualized. No aortic stenosis is present.  8. The inferior vena cava is dilated in size with >50% respiratory variability, suggesting right atrial pressure of 8 mmHg. FINDINGS  Left Ventricle: Left ventricular ejection fraction, by estimation, is 60 to 65%. The left ventricle has normal function. The left ventricle has no regional wall motion abnormalities. Strain was performed and the global longitudinal strain is indeterminate. The left ventricular internal cavity size was normal in size. There is no left ventricular  hypertrophy. Left ventricular diastolic parameters are consistent with Grade I diastolic dysfunction (impaired relaxation). Right Ventricle: The right ventricular size is severely enlarged. No increase in right ventricular wall thickness.  Right ventricular systolic function is severely reduced. Left Atrium: Left atrial size was normal in size. Right Atrium: Right atrial size was severely dilated. Pericardium: A small pericardial effusion is present. The pericardial effusion is posterior to the left ventricle. Mitral Valve: The mitral valve is normal in structure. No evidence of mitral valve regurgitation. No evidence of mitral valve stenosis. Tricuspid Valve: The tricuspid valve is normal in structure. Tricuspid valve regurgitation is mild . No evidence of tricuspid stenosis. Aortic Valve: The aortic valve is tricuspid. Aortic valve regurgitation is not visualized. No aortic stenosis is present. Aortic valve mean gradient measures 2.0 mmHg. Aortic valve peak gradient measures 3.1 mmHg. Aortic valve area, by VTI measures 2.53 cm. Pulmonic Valve: The pulmonic valve was normal in structure. Pulmonic valve regurgitation is not visualized. No evidence of pulmonic stenosis. Aorta: The aortic root is normal in size and structure. Venous: The inferior vena cava is dilated in size with greater than 50% respiratory variability, suggesting right atrial pressure of 8 mmHg. IAS/Shunts: No atrial level shunt detected by color flow Doppler. Additional Comments: Severe right ventricular heart failure. Patient had a CTA negative for PE 02/07/24. Consider CHF consult and right heart cath. 3D was performed not requiring image post processing on an independent workstation and was indeterminate.  LEFT VENTRICLE PLAX 2D LVIDd:         3.60 cm   Diastology LVIDs:         1.80 cm   LV e' medial:  4.46 cm/s LV PW:         1.10 cm   LV e' lateral: 6.64 cm/s LV IVS:        1.10 cm LVOT diam:     2.00 cm LV SV:         34 LV SV Index:   22  LVOT Area:     3.14 cm  RIGHT VENTRICLE            IVC RV Basal diam:  4.60 cm    IVC diam: 2.40 cm RV Mid diam:    4.60 cm RV S prime:     6.64 cm/s TAPSE (M-mode): 1.2 cm LEFT ATRIUM         Index       RIGHT ATRIUM           Index LA diam:    2.40 cm 1.58 cm/m  RA Area:     23.20 cm                                 RA Volume:   77.60 ml  50.99 ml/m  AORTIC VALVE AV Area (Vmax):    2.48 cm AV Area (Vmean):   2.33 cm AV Area (VTI):     2.53 cm AV Vmax:           87.50 cm/s AV Vmean:          67.200 cm/s AV VTI:            0.134 m AV Peak Grad:      3.1 mmHg AV Mean Grad:      2.0 mmHg LVOT Vmax:         69.10 cm/s LVOT Vmean:        49.900 cm/s LVOT VTI:          0.108 m LVOT/AV VTI ratio: 0.81  AORTA Ao Root diam: 3.20 cm Ao Asc diam:  2.60 cm TRICUSPID VALVE TR Peak grad:   70.2 mmHg TR Vmax:        419.00 cm/s  SHUNTS Systemic VTI:  0.11 m Systemic Diam: 2.00 cm Maude Emmer MD Electronically signed by Maude Emmer MD Signature Date/Time: 03/24/2024/2:41:15 PM    Final    DG Chest 2 View Result Date: 03/23/2024 EXAM: 2 VIEW(S) XRAY OF THE CHEST 03/23/2024 01:52:00 PM COMPARISON: 02/28/2024 CLINICAL HISTORY: CP. PT was doing her stress test for her heart and became syncopal. Pt was seen here for Chest pain in July. HX: Cardiology patient, HTN, Heart failure, diabetes. FINDINGS: LUNGS AND PLEURA: No focal pulmonary opacity. No pulmonary edema. No pleural effusion. No pneumothorax. HEART AND MEDIASTINUM: Mild cardiomegaly. BONES AND SOFT TISSUES: No acute osseous abnormality. IMPRESSION: 1. Mild cardiomegaly, without congestive failure . Electronically signed by: Rockey Kilts MD 03/23/2024 02:02 PM EDT RP Workstation: [id number]   CT Angio Chest/Abd/Pel for Dissection W and/or Wo Contrast Result Date: 02/28/2024 CLINICAL DATA:  Acute aortic syndrome EXAM: CT ANGIOGRAPHY CHEST, ABDOMEN AND PELVIS TECHNIQUE: Non-contrast CT of the chest was initially obtained. Multidetector CT imaging through the chest,  abdomen and pelvis was performed using the standard protocol during bolus administration of intravenous contrast. Multiplanar reconstructed images and MIPs were obtained and reviewed to evaluate the vascular anatomy. RADIATION DOSE REDUCTION: This exam was performed according to the departmental dose-optimization program which includes automated exposure control, adjustment of the mA and/or kV according to patient size and/or use of iterative reconstruction technique. CONTRAST:  OMNIPAQUE  IOHEXOL  350 MG/ML SOLN COMPARISON:  CT chest, abdomen and pelvis 02/07/2024 FINDINGS: CTA CHEST FINDINGS Cardiovascular: Preferential opacification of the thoracic aorta. No evidence of thoracic aortic aneurysm or dissection. No pericardial effusion. The main pulmonary artery is enlarged. The heart is mildly enlarged. Mediastinum/Nodes: No enlarged mediastinal, hilar, or axillary lymph nodes. Thyroid gland, trachea, and esophagus demonstrate no significant findings. Lungs/Pleura: There is a stable ground-glass nodule in the right upper lobe measuring 5 mm image 7/38. There is a stable 2 mm nodule in the left lung base/left lower lobe. The lungs are otherwise clear. There is no pleural effusion or pneumothorax. No pleural effusion or pneumothorax. Musculoskeletal: No chest wall abnormality. No acute or significant osseous findings. Review of the MIP images confirms the above findings. CTA ABDOMEN AND PELVIS FINDINGS VASCULAR Aorta: He choose 1 there is minimal calcified atherosclerotic disease of the aorta. Celiac: Patent without evidence of aneurysm, dissection, vasculitis or significant stenosis. SMA: Patent without evidence of aneurysm, dissection, vasculitis or significant stenosis. Renals: Both renal arteries are patent without evidence of aneurysm, dissection, vasculitis, fibromuscular dysplasia or significant stenosis. IMA: Patent without evidence of aneurysm, dissection, vasculitis or significant stenosis. Inflow:  Patent without evidence of aneurysm, dissection, vasculitis or significant stenosis. Veins: No obvious venous abnormality within the limitations of this arterial phase study. Review of the MIP images confirms the above findings. NON-VASCULAR Hepatobiliary: Previously identified enhancing hepatic mass in the left lobe of the liver is grossly unchanged measuring 3 cm. No new hepatic masses are seen. Pancreas: Unremarkable. No pancreatic ductal dilatation or surrounding inflammatory changes. Spleen: Normal in size without focal abnormality. Adrenals/Urinary Tract: Adrenal glands are unremarkable. Kidneys are normal, without renal calculi, focal lesion, or hydronephrosis. Bladder is unremarkable. Stomach/Bowel: There is sigmoid colon diverticulosis. There is mild inflammation surrounding a single diverticulum in the proximal sigmoid colon image 11/240. There is no evidence for bowel obstruction, abscess or free air. The appendix is within normal limits. Stomach is within  normal limits. Lymphatic: No enlarged lymph nodes. Reproductive: Lobulated uterine contour again seen a fibroid change. Adnexa are unremarkable. Other: No abdominal wall hernia or abnormality. No abdominopelvic ascites. Musculoskeletal: No acute or significant osseous findings. Review of the MIP images confirms the above findings. IMPRESSION: 1. No evidence for aortic aneurysm or dissection. 2. Mild acute uncomplicated sigmoid colon diverticulitis. 3. Stable enhancing hepatic mass in the left lobe of the liver. 4. Stable pulmonary nodules. Largest nodule is ground-glass measuring 5 mm. No follow-up needed if patient is low-risk (and has no known or suspected primary neoplasm). Non-contrast chest CT can be considered in 12 months if patient is high-risk. This recommendation follows the consensus statement: Guidelines for Management of Incidental Pulmonary Nodules Detected on CT Images: From the Fleischner Society 2017; Radiology 2017; 284:228-243. 5.  Fibroid uterus. Electronically Signed   By: Greig Pique M.D.   On: 02/28/2024 17:02   DG Chest Port 1 View Result Date: 02/28/2024 CLINICAL DATA:  [id number] Chest pain [id number] EXAM: PORTABLE CHEST - 1 VIEW COMPARISON:  February 07, 2024 FINDINGS: No focal airspace consolidation, pleural effusion, or pneumothorax. Mild cardiomegaly.No acute fracture or destructive lesion. Multilevel thoracic osteophytosis. IMPRESSION: No acute cardiopulmonary abnormality. Electronically Signed   By: Rogelia Myers M.D.   On: 02/28/2024 14:33    Microbiology: Results for orders placed or performed during the hospital encounter of 10/15/10  Culture, blood (routine x 2)     Status: None   Collection Time: 10/15/10  7:45 PM   Specimen: BLOOD  Result Value Ref Range Status   Specimen Description BLOOD LEFT ARM  5 ML IN [facility] BOTTLE  Final   Special Requests IMMUNE:NORM VAR  Final   Culture  Setup Time [id number]  Final   Culture NO GROWTH 5 DAYS  Final   Report Status 10/21/2010 FINAL  Final  Culture, blood (routine x 2)     Status: None   Collection Time: 10/15/10  7:50 PM   Specimen: BLOOD  Result Value Ref Range Status   Specimen Description BLOOD LEFT HAND  5 ML IN [facility] BOTTLE  Final   Special Requests IMMUNE:NORM VAR  Final   Culture  Setup Time [id number]  Final   Culture NO GROWTH 5 DAYS  Final   Report Status 10/21/2010 FINAL  Final    Labs: CBC: Recent Labs  Lab 03/23/24 1322 03/24/24 0339 03/25/24 1258 03/25/24 1301 03/25/24 1302 03/25/24 1818  WBC 11.2* 9.6  --   --   --  11.6*  NEUTROABS 8.2*  --   --   --   --   --   HGB 16.0* 15.6* 17.3* 17.3* 17.3* 15.9*  HCT 47.8* 46.9* 51.0* 51.0* 51.0* 49.2*  MCV 90.2 90.5  --   --   --  91.8  PLT 229 227  --   --   --  243   Basic Metabolic Panel: Recent Labs  Lab 03/23/24 1322 03/24/24 0339 03/25/24 0308 03/25/24 0830 03/25/24 1258 03/25/24 1301 03/25/24 1302 03/26/24 0240  NA 141 142 137  --  139 139 139 137  K 3.8 3.7 3.3*  --   3.6 3.7 3.6 4.7  CL 102 106 99  --   --   --   --  105  CO2 26 26 25   --   --   --   --  23  GLUCOSE 81 105* 94  --   --   --   --  87  BUN 18 16  19  --   --   --   --  20  CREATININE 1.21* 1.05* 1.09*  --   --   --   --  1.10*  CALCIUM  9.4 9.0 8.9  --   --   --   --  9.0  MG  --  2.2  --  2.1  --   --   --   --    Liver Function Tests: Recent Labs  Lab 03/23/24 1322 03/24/24 0339 03/24/24 2141  AST 49* 33 35  ALT 62* 44 43  ALKPHOS 85 64 74  BILITOT 0.5 0.9 1.0  PROT 6.7 5.9* 6.8  ALBUMIN  4.2 3.1* 3.5   CBG: No results for input(s): GLUCAP in the last 168 hours.  Discharge time spent: greater than 30 minutes.  Signed: Elidia Toribio Furnace, MD [facility] 03/26/2024

## 2024-03-26 NOTE — Plan of Care (Signed)

## 2024-03-26 NOTE — Assessment & Plan Note (Signed)
Smoking cessation counseling 

## 2024-03-26 NOTE — Assessment & Plan Note (Addendum)
 Pulmonary hypertension, likely group 1 or group 5   Echocardiogram with preserved LV systolic function with EF 60 to 65%, RV systolic function with severe reduction, severe RV enlargement, RA with severe dilatation, LA normal size, small pericardial effusion, no significant valvular disease.   08/06 cardiac catheterization (left with normal coronaries)   RA mean 6 RV 67/89 PA 65/40 mean 49  PCWP 2 PVR 18.8  Cardiac output 2,49 and index 1,65 (Fick)   Ana negative Scleroderma ab <0.2   Pulmonary V/Q scan low probability for pulmonary embolism. No segmental peripheral perfusion defects to suggest pulmonary embolism.   Patient was placed on furosemide  for diuresis, negative fluid balance was achieved, -2.133 ml with improvement of her symptoms.   Plan to continue midodrine  for blood pressure support. Tadalafil  and digoxin .  As needed furosemide  for signs of volume overload.  Working on starting mecitentan as outpatient.  Patient may need prostacyclin agonist infusion, to start as outpatient.  Advised to stop amphetamines and cocaine.

## 2024-03-26 NOTE — Plan of Care (Signed)
   Problem: Activity: Goal: Risk for activity intolerance will decrease Outcome: Progressing   Problem: Coping: Goal: Level of anxiety will decrease Outcome: Progressing

## 2024-03-26 NOTE — Plan of Care (Signed)
   Problem: Activity: Goal: Risk for activity intolerance will decrease Outcome: Progressing   Problem: Coping: Goal: Level of anxiety will decrease Outcome: Progressing   Problem: Elimination: Goal: Will not experience complications related to bowel motility Outcome: Progressing   Problem: Safety: Goal: Ability to remain free from injury will improve Outcome: Progressing

## 2024-03-26 NOTE — Progress Notes (Signed)
 Mobility Specialist Progress Note:    03/26/24 1345  Mobility  Activity Ambulated independently  Level of Assistance Independent after set-up  Assistive Device None  Distance Ambulated (ft) 500 ft  Activity Response Tolerated well  Mobility Referral Yes  Mobility visit 1 Mobility  Mobility Specialist Start Time (ACUTE ONLY) 1345  Mobility Specialist Stop Time (ACUTE ONLY) 1354  Mobility Specialist Time Calculation (min) (ACUTE ONLY) 9 min   Pt doing well and eager to session. Able to move and ambulate w/ no assist. No c/o any symptoms. Took orthostatics on pt (see flowsheet). Pt pressure running low, notified RN. Pt back in bed w/ all needs met and mother in room.   Venetia Keel Mobility Specialist Please Neurosurgeon or Rehab Office at [phone]

## 2024-03-26 NOTE — Progress Notes (Addendum)
 Patient ID: Nancy [name], female   DOB: September 30, 1975, 48 y.o.   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   Subjective:    Breathing is better, no chest pain.  Had some lightheadedness last night walking to bathroom. SBP 90s.   2D Echo EF 60-65%, severe RV enlargement and severe RV dysfunction, severe RAE, LV compressed by RV, small pericardial effusion   Rheumatologic serologies pending.  CTA chest negative for PE/lung parenchymal disease. V/Q not completed yet   Coronary angiography: no significant coronary disease.  RHC Procedural Findings: Hemodynamics (mmHg) RA mean 6 RV 678/9 PA 65/40, mean 49 PCWP mean 2 LV 84/2 AO 83/65 Oxygen saturations: SVC 58% PA 60% AO 96% Cardiac Output (Fick) 2.49  Cardiac Index (Fick) 1.65 PVR 18.8   Objective:   Weight Range: 55.2 kg Body mass index is 23.75 kg/m.   Vital Signs:   Temp:  [98.5 F (36.9 C)-98.6 F (37 C)] 98.5 F (36.9 C) (08/07 0441) Pulse Rate:  [75-116] 94 (08/07 0441) Resp:  [11-34] 18 (08/07 0441) BP: (79-116)/(54-87) 95/62 (08/07 0441) SpO2:  [85 %-100 %] 96 % (08/07 0441) Weight:  [55.2 kg] 55.2 kg (08/07 0441) Last BM Date : 03/22/24  Weight change: Filed Weights   03/24/24 0537 03/25/24 0450 03/26/24 0441  Weight: 56.1 kg 55.4 kg 55.2 kg    Intake/Output:   Intake/Output Summary (Last 24 hours) at 03/26/2024 0736 Last data filed at 03/26/2024 0441 Gross per 24 hour  Intake 520 ml  Output 1700 ml  Net -1180 ml      Physical Exam    Vitals:   03/25/24 2321 03/26/24 0441  BP: (!) 87/54 95/62  Pulse: 97 94  Resp: 17 18  Temp: 98.6 F (37 C)  98.5 F (36.9 C)  SpO2: 97% 96%   General: NAD Neck: No JVD, no thyromegaly or thyroid nodule.  Lungs: Clear to auscultation bilaterally with normal respiratory effort. CV: Nondisplaced PMI.  Heart regular S1/S2 with loud P2, no S3/S4, no murmur.  No peripheral edema.   Abdomen: Soft, nontender, no hepatosplenomegaly, no distention.  Skin: Intact without lesions or rashes.  Neurologic: Alert and oriented x 3.  Psych: Normal affect. Extremities: No clubbing or cyanosis.  HEENT: Normal.    Telemetry   NSR 80s, personally reviewed   Labs    CBC Recent Labs    03/23/24 1322 03/24/24 0339 03/25/24 1258 03/25/24 1302 03/25/24 1818  WBC 11.2* 9.6  --   --  11.6*  NEUTROABS 8.2*  --   --   --   --   HGB 16.0* 15.6*   < > 17.3* 15.9*  HCT 47.8* 46.9*   < > 51.0* 49.2*  MCV 90.2 90.5  --   --  91.8  PLT 229 227  --   --  243   < > = values in this interval not displayed.   Basic Metabolic Panel Recent Labs    91/94/74 0339 03/25/24 0308 03/25/24 0830 03/25/24 1258 03/25/24 1302 03/26/24 0240  NA 142 137  --    < > 139 137  K 3.7 3.3*  --    < >  3.6 4.7  CL 106 99  --   --   --  105  CO2 26 25  --   --   --  23  GLUCOSE 105* 94  --   --   --  87  BUN 16 19  --   --   --  20  CREATININE 1.05* 1.09*  --   --   --  1.10*  CALCIUM  9.0 8.9  --   --   --  9.0  MG 2.2  --  2.1  --   --   --    < > = values in this interval not displayed.   Liver Function Tests Recent Labs    03/24/24 0339 03/24/24 2141  AST 33 35  ALT 44 43  ALKPHOS 64 74  BILITOT 0.9 1.0  PROT 5.9* 6.8  ALBUMIN  3.1* 3.5   No results for input(s): LIPASE, AMYLASE in the last 72 hours. Cardiac Enzymes No results for input(s): CKTOTAL, CKMB, CKMBINDEX, TROPONINI in the last 72 hours.  BNP: BNP (last 3 results) No results for input(s): BNP in the last 8760 hours.  ProBNP (last 3 results) Recent Labs    02/07/24 1809 02/28/24 1407 03/23/24 1322  PROBNP 6,349.0* 5,296.0*  6,257.0*     D-Dimer Recent Labs    03/23/24 1342  DDIMER 0.30   Hemoglobin A1C Recent Labs    03/25/24 0807  HGBA1C 6.5*   Fasting Lipid Panel Recent Labs    03/25/24 0807  CHOL 167  HDL 52  LDLCALC 86  TRIG 145  CHOLHDL 3.2   Thyroid Function Tests No results for input(s): TSH, T4TOTAL, T3FREE, THYROIDAB in the last 72 hours.  Invalid input(s): FREET3  Other results:   Imaging    CARDIAC CATHETERIZATION Result Date: 03/25/2024 1. Low left heart filling pressures. 2. Normal RV filling pressure. 3. Moderate to severe pulmonary arterial hypertension. 4. Low cardiac output. 5. Normal coronaries.   DG Chest 2 View Result Date: 03/25/2024 CLINICAL DATA:  Dyspnea.  Chest pain. EXAM: CHEST - 2 VIEW COMPARISON:  03/23/2024. FINDINGS: The heart size and mediastinal contours are unchanged. No overt pulmonary edema, focal consolidation, pleural effusion, or pneumothorax. No acute osseous abnormality. IMPRESSION: No acute cardiopulmonary findings. Electronically Signed   By: Harrietta Sherry M.D.   On: 03/25/2024 12:32     Medications:     Scheduled Medications:  aspirin   81 mg Oral Daily   digoxin   0.125 mg Oral Daily   enoxaparin  (LOVENOX ) injection  40 mg Subcutaneous Q24H   sodium chloride  flush  3 mL Intravenous Q12H   sodium chloride  flush  3 mL Intravenous Q12H   tadalafil   40 mg Oral Daily    Infusions:  sodium chloride       PRN Medications: sodium chloride , acetaminophen , calcium  carbonate, ondansetron  (ZOFRAN ) IV, senna-docusate, sodium chloride  flush     Assessment/Plan   1. RV failure: Echo this admission with EF 60-65%, severe RV enlargement and severe RV dysfunction, severe RAE, LV compressed by RV, small pericardial effusion.  ECG shows RVH.  I strongly suspect severe pulmonary hypertension though PA pressures could not be estimated from the echo.  She has had symptoms for about 2 months, getting quite severe with exertional dyspnea and  chest pain as well as presyncope.  She has had 2 CTA chest studies in the last 2 months with no PE and minimal lung parenchymal disease.  No known rheumatologic disorder.  No FH of pulmonary hypertension/RV failure.  She uses amphetamines regularly (daily over last year) and also cocaine, both can be causes of pulmonary hypertension. She has a liver mass by CT that has been biopsied but not cirrhosis, doubt portopulmonary hypertension. RHC showed moderate-severe pulmonary arterial hypertension with CI 1.65.  She is not volume overloaded.  BP running on the low side.  - No Lasix  today, can use prn at home.  - Continue digoxin  0.125.  - I am going to start midodrine  5 mg tid for now with BP low at times and occasional lightheadedness.  - Increase tadalafil  to 40 mg daily.  She will need aggressive combination PH therapy as outpatient, tadalafil  is the only medication we can start as inpatient.  HF pharmacist is working on getting macitentan for her as outpatient. With low output, will work on getting her on prostacyclin receptor agonist quickly as well.  - V/Q scan ordered to rule out chronic PE.  - Rheumatologic serologies, sent. Results pending  - Imperative to stop amphetamines/cocaine. We discussed this.  - Low cardiac output in setting of severe PAH shows increased risk.  We should consider IV prostacyclin agonist in future but for continuous IV infusion she is going to have to show that she can stay off amphetamine/cocaine. 2. Liver lesion: Patient was found incidentally by CT to have liver mass but not cirrhosis.   - S/p liver biopsy, report not available yet.  3. Smoking:  encouraged her to quit.  - Nicotine  patch.   If she walks without lightheadedness and gets V/Q scan, she may be able to go home today.  Length of Stay: 3  Ezra Shuck, MD  03/26/2024, 7:36 AM  Advanced Heart Failure Team Pager [phone] (M-F; 7a - 5p)  Please contact [facility] for night-coverage after hours (5p  -7a ) and weekends on [email or web address]

## 2024-03-26 NOTE — Assessment & Plan Note (Signed)
 08/01 sp biopsy at [facility], pending results.

## 2024-03-26 NOTE — Assessment & Plan Note (Signed)
 Hypotension, due to RV failure.   Patient has been placed on midodrine  for blood pressure support.  Will need close follow up as outpatient

## 2024-03-26 NOTE — Telephone Encounter (Signed)
 Advanced Heart Failure Patient Advocate Encounter  Additional information for PA sent in via efax to, [phone]. Phone number for follow up, [phone].

## 2024-03-27 ENCOUNTER — Other Ambulatory Visit (HOSPITAL_COMMUNITY): Payer: Self-pay

## 2024-03-27 DIAGNOSIS — I50811 Acute right heart failure: Secondary | ICD-10-CM | POA: Diagnosis not present

## 2024-03-27 DIAGNOSIS — Z720 Tobacco use: Secondary | ICD-10-CM | POA: Diagnosis not present

## 2024-03-27 DIAGNOSIS — I10 Essential (primary) hypertension: Secondary | ICD-10-CM | POA: Diagnosis not present

## 2024-03-27 DIAGNOSIS — R160 Hepatomegaly, not elsewhere classified: Secondary | ICD-10-CM | POA: Diagnosis not present

## 2024-03-27 LAB — BASIC METABOLIC PANEL WITH GFR
Anion gap: 8 (ref 5–15)
BUN: 18 mg/dL (ref 6–20)
CO2: 23 mmol/L (ref 22–32)
Calcium: 8.8 mg/dL — ABNORMAL LOW (ref 8.9–10.3)
Chloride: 105 mmol/L (ref 98–111)
Creatinine, Ser: 1.01 mg/dL — ABNORMAL HIGH (ref 0.44–1.00)
GFR, Estimated: >60 mL/min (ref >60)
Glucose, Bld: 95 mg/dL (ref 70–99)
Potassium: 4.4 mmol/L (ref 3.5–5.1)
Sodium: 136 mmol/L (ref 135–145)

## 2024-03-27 LAB — CBC
HCT: 44.1 % (ref 36.0–46.0)
Hemoglobin: 14.7 g/dL (ref 12.0–15.0)
MCH: 29.9 pg (ref 26.0–34.0)
MCHC: 33.3 g/dL (ref 30.0–36.0)
MCV: 89.6 fL (ref 80.0–100.0)
Platelets: 227 K/uL (ref 150–400)
RBC: 4.92 MIL/uL (ref 3.87–5.11)
RDW: 13.7 % (ref 11.5–15.5)
WBC: 11.3 K/uL — ABNORMAL HIGH (ref 4.0–10.5)
nRBC: 0 % (ref 0.0–0.2)

## 2024-03-27 LAB — CYCLIC CITRUL PEPTIDE ANTIBODY, IGG/IGA: CCP Antibodies IgG/IgA: 7 U (ref 0–19)

## 2024-03-27 LAB — LACTIC ACID, PLASMA
Lactic Acid, Venous: 2 mmol/L (ref 0.5–1.9)
Lactic Acid, Venous: 1.3 mmol/L (ref 0.5–1.9)

## 2024-03-27 LAB — MAGNESIUM: Magnesium: 2.1 mg/dL (ref 1.7–2.4)

## 2024-03-27 MED ORDER — FUROSEMIDE 20 MG PO TABS
20.0000 mg | ORAL_TABLET | Freq: Every day | ORAL | 0 refills | Status: DC | PRN
  Filled 2024-03-27: qty 30, 30d supply, fill #0

## 2024-03-27 MED ORDER — TADALAFIL (PAH) 20 MG PO TABS
40.0000 mg | ORAL_TABLET | Freq: Every day | ORAL | 0 refills | Status: DC
Start: 2024-03-27 — End: 2024-04-13
  Filled 2024-03-27: qty 30, 15d supply, fill #0

## 2024-03-27 MED ORDER — POTASSIUM CHLORIDE CRYS ER 10 MEQ PO TBCR
10.0000 meq | EXTENDED_RELEASE_TABLET | Freq: Every day | ORAL | 0 refills | Status: DC | PRN
  Filled 2024-03-27: qty 30, 30d supply, fill #0

## 2024-03-27 MED ORDER — FUROSEMIDE 20 MG PO TABS: 20.0000 mg | ORAL_TABLET | Freq: Every day | ORAL | Status: DC | PRN

## 2024-03-27 NOTE — Progress Notes (Signed)
 Vitals:   03/27/24 0447 03/27/24 0539  BP: (!) 82/58 (!) 65/45  Pulse: 98   Resp: 18   Temp: 98.9 F (37.2 C)   SpO2: 98%    Called a Rapid , MD at bedside after taking manual B/P of 65/45

## 2024-03-27 NOTE — Progress Notes (Addendum)
 Patient ID: Nancy [name], female   DOB: [birth year], 48 y.o.   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   Subjective:    Discharge delayed yesterday with hypotension into the 60s. Midodrine  increased to 10 mg TID.   Studies reviewed: 2D Echo EF 60-65%, severe RV enlargement and severe RV dysfunction, severe RAE, LV compressed by RV, small pericardial effusion   Rheumatologic serologies pending.  CTA chest negative for PE/lung parenchymal disease. V/Q scan with no PEs  Coronary angiography: no significant coronary disease.  RHC Procedural Findings: RA mean 6, RV 678/9, PA 65/40, mean 49, PCWP mean 2, LV 84/2, AO 83/65 Oxygen saturations:, SVC 58%, PA 60%, AO 96%, Cardiac Output (Fick) 2.49 , Cardiac Index (Fick) 1.65, PVR 18.8   Objective:   Weight Range: 56.8 kg Body mass index is 24.45 kg/m.   Vital Signs:   Temp:  [97.6 F (36.4 C)-99 F (37.2 C)] 98.9 F (37.2 C) (08/08 0447) Pulse Rate:  [78-105] 78 (08/08 0646) Resp:  [17-18] 18 (08/08 0447) BP: (65-105)/(45-67) 95/66 (08/08 0646) SpO2:  [95 %-98 %] 98 % (08/08 0447) Weight:  [56.8 kg] 56.8 kg (08/08 0447) Last BM Date : 03/22/24  Weight change: Filed Weights   03/25/24 0450 03/26/24 0441 03/27/24 0447  Weight: 55.4 kg 55.2 kg 56.8 kg    Intake/Output:   Intake/Output Summary (Last 24 hours) at 03/27/2024 0712 Last data filed at 03/27/2024 0451 Gross per 24 hour  Intake 1307 ml  Output 1050 ml  Net 257 ml    Physical Exam    Vitals:   03/27/24 0539 03/27/24 0646  BP: (!) 65/45 95/66  Pulse:  78  Resp:    Temp:    SpO2:    General:  well appearing.  No  respiratory difficulty Neck: JVD ~6 cm.  Cor: Regular rate & rhythm. No murmurs. Lungs: clear Extremities: no edema  Neuro: alert & oriented x 3. Affect pleasant.   Telemetry   NSR 70s, personally reviewed   Labs    CBC Recent Labs    03/25/24 1302 03/25/24 1818  WBC  --  11.6*  HGB 17.3* 15.9*  HCT 51.0* 49.2*  MCV  --  91.8  PLT  --  243   Basic Metabolic Panel Recent Labs    91/93/74 0830 03/25/24 1258 03/26/24 0240 03/27/24 0321  NA  --    < > 137 136  K  --    < > 4.7 4.4  CL  --   --  105 105  CO2  --   --  23 23  GLUCOSE  --   --  87 95  BUN  --   --  20 18  CREATININE  --   --  1.10* 1.01*  CALCIUM   --   --  9.0 8.8*  MG 2.1  --   --  2.1   < > = values in this interval not displayed.   Liver Function Tests Recent Labs    03/24/24 2141  AST 35  ALT 43  ALKPHOS 74  BILITOT 1.0  PROT 6.8  ALBUMIN  3.5  No results for input(s): LIPASE, AMYLASE in the last 72 hours. Cardiac Enzymes No results for input(s): CKTOTAL, CKMB, CKMBINDEX, TROPONINI in the last 72 hours.  BNP: BNP (last 3 results) No results for input(s): BNP in the last 8760 hours.  ProBNP (last 3 results) Recent Labs    02/07/24 1809 02/28/24 1407 03/23/24 1322  PROBNP 6,349.0* 5,296.0* 6,257.0*     D-Dimer No results for input(s): DDIMER in the last 72 hours.  Hemoglobin A1C Recent Labs    03/25/24 0807  HGBA1C 6.5*   Fasting Lipid Panel Recent Labs    03/25/24 0807  CHOL 167  HDL 52  LDLCALC 86  TRIG 145  CHOLHDL 3.2   Thyroid Function Tests No results for input(s): TSH, T4TOTAL, T3FREE, THYROIDAB in the last 72 hours.  Invalid input(s): FREET3  Other results:   Imaging    NM Pulmonary Perfusion Result Date: 03/26/2024 CLINICAL DATA:  Severe right heart failure EXAM: NUCLEAR MEDICINE PERFUSION LUNG SCAN TECHNIQUE: Perfusion images were obtained in multiple projections after intravenous injection of radiopharmaceutical.  Ventilation scans intentionally deferred if perfusion scan and chest x-ray adequate for interpretation during COVID 19 epidemic. RADIOPHARMACEUTICALS:  4.4 mCi Tc-72m MAA IV COMPARISON:  Chest radiograph 03/25/2024 and CTA chest 02/28/2024 FINDINGS: Comparison chest radiograph demonstrates no acute abnormality. Perfusion imaging demonstrates no segmental or subsegmental perfusion abnormalities. IMPRESSION: No segmental peripheral perfusion defects to suggest pulmonary embolism. Electronically Signed   By: Norman Gatlin M.D.   On: 03/26/2024 18:51     Medications:     Scheduled Medications:  aspirin   81 mg Oral Daily   digoxin   0.125 mg Oral Daily   enoxaparin  (LOVENOX ) injection  40 mg Subcutaneous Q24H   midodrine   10 mg Oral TID WC   nicotine   14 mg Transdermal Daily   sodium chloride  flush  3 mL Intravenous Q12H   sodium chloride  flush  3 mL Intravenous Q12H   tadalafil   40 mg Oral Daily    Infusions:    PRN Medications: acetaminophen , calcium  carbonate, ondansetron  (ZOFRAN ) IV, senna-docusate, sodium chloride  flush  Assessment/Plan  1. RV failure: Echo this admission with EF 60-65%, severe RV enlargement and severe RV dysfunction, severe RAE, LV compressed by RV, small pericardial effusion.  ECG shows RVH.  I strongly suspect severe pulmonary hypertension though PA pressures could not be estimated from the echo.  She has had symptoms for about 2 months, getting quite severe with exertional dyspnea and chest pain as well as presyncope.  She has had 2 CTA chest studies in the last 2 months with no PE and minimal lung parenchymal disease.  No known rheumatologic disorder.  No FH of pulmonary hypertension/RV failure.  She uses amphetamines regularly (daily over last year) and also cocaine, both can be causes of pulmonary hypertension. She has a liver mass by CT that has been biopsied but not cirrhosis, doubt portopulmonary hypertension. RHC showed moderate-severe pulmonary arterial  hypertension with CI 1.65.  She is not volume overloaded.  BP running on the low side.  - No Lasix  today, can use prn at home.  - Continue digoxin  0.125.  - Midodrine  increased to 10 mg tid with BP low at times and occasional lightheadedness.  - Continue tadalafil  40 mg daily.  She will need aggressive combination PH therapy as outpatient, tadalafil  is the only medication we can start as inpatient.  HF pharmacist is working on getting macitentan for her as outpatient. With low output, will work on getting her on prostacyclin receptor  agonist quickly as well.  - V/Q scan yesterday with no PE - Rheumatologic serologies, sent. Results pending  - Imperative to stop amphetamines/cocaine. We discussed this.  - Low cardiac output in setting of severe PAH shows increased risk.  We should consider IV prostacyclin agonist in future but for continuous IV infusion she is going to have to show that she can stay off amphetamine/cocaine.  2. Liver lesion: Patient was found incidentally by CT to have liver mass but not cirrhosis.   - S/p liver biopsy, report not available yet.   3. Smoking:  encouraged her to quit.  - Nicotine  patch.   Length of Stay: 4  Beckey LITTIE Coe, NP  03/27/2024, 7:12 AM  Advanced Heart Failure Team Pager [phone] (M-F; 7a - 5p)  Please contact [facility] for night-coverage after hours (5p -7a ) and weekends on [email or web address]  Patietn seen with NP, I formulated the plan and agree with the above note.   BP runs low (SBP 80s-90s) but she denies lightheadedness.  No dyspnea.   General: NAD Neck: No JVD, no thyromegaly or thyroid nodule.  Lungs: Clear to auscultation bilaterally with normal respiratory effort. CV: Nondisplaced PMI.  Heart regular S1/S2 with loud P2, no S3/S4, no murmur.  No peripheral edema.   Abdomen: Soft, nontender, no hepatosplenomegaly, no distention.  Skin: Intact without lesions or rashes.  Neurologic: Alert and oriented x 3.  Psych: Normal  affect. Extremities: No clubbing or cyanosis.  HEENT: Normal.   OK for home today.  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  Cardiac output low, think she will not be candidate for IV therapy until she can show that she can stay off drugs (regular use of amphetamines, cocaine).  She will go home on tadalafil , digoxin , midodrine .  Close followup in CHF clinic.   Ezra Shuck 03/27/2024

## 2024-03-27 NOTE — Progress Notes (Signed)
 Patient is feeling better today, this morning with lower extremity cramps. Her discharge was delayed in order to get tadalafil  prescription.   BP 90/62 (BP Location: Left Arm)   Pulse 78   Temp 98.8 F (37.1 C) (Oral)   Resp 19   Ht 5' (1.524 m)   Wt 56.8 kg   SpO2 98%   BMI 24.45 kg/m   Neurology awake and alert ENT with no pallor or icterus Cardiovascular with S1 and S2 present and regular, no murmurs or gallops Respiratory with no wheezing or rhonchi  Abdomen with no distention  No lower extremity edema.   Pulmonary hypertension with RV failure.  Will add as needed furosemide  to her medical regimen, check ambulatory 02 saturation on room air Plan for discharge home today.

## 2024-03-27 NOTE — Progress Notes (Signed)
 Rapid called, MD at bedside and new labs ordered STAT

## 2024-03-27 NOTE — TOC Transition Note (Signed)
 Transition of Care [facility]) - Discharge Note   Patient Details  Name: Nancy [name] MRN: [id number] Date of Birth: 09-Mar-1976  Transition of [facility] CM/SW Contact:  Andrez JULIANNA George, RN Phone Number: 03/27/2024, 9:38 AM   Clinical Narrative:     Pt is discharging home with self care. Pharmacy has worked out the [employer] for d/c.  Pt has transportation home.  Final next level of care: Home/Self Care Barriers to Discharge: No Barriers Identified   Patient Goals and CMS Choice Patient states their goals for this hospitalization and ongoing recovery are:: wants to recover          Discharge Placement                       Discharge Plan and Services Additional resources added to the After Visit Summary for     Discharge Planning Services: CM Consult                                 Social Drivers of Health (SDOH) Interventions SDOH Screenings   Food Insecurity: No Food Insecurity (03/23/2024)  Recent Concern: Food Insecurity - Medium Risk (03/12/2024)   Received from [facility]  Housing: Low Risk  (03/23/2024)  Transportation Needs: No Transportation Needs (03/23/2024)  Utilities: Not At Risk (03/23/2024)  Tobacco Use: High Risk (03/26/2024)     Readmission Risk Interventions    03/24/2024    2:51 PM  Readmission Risk Prevention Plan  Medication Screening Complete  Transportation Screening Complete

## 2024-03-27 NOTE — Telephone Encounter (Signed)
 Advanced Heart Failure Patient Advocate Encounter  Additional information for PA sent in via efax to, [phone]. Phone number for follow up, [phone].

## 2024-03-27 NOTE — Plan of Care (Signed)
   Problem: Education: Goal: Knowledge of General Education information will improve Description Including pain rating scale, medication(s)/side effects and non-pharmacologic comfort measures Outcome: Progressing

## 2024-03-27 NOTE — Telephone Encounter (Addendum)
 Advanced Heart Failure Patient Advocate Encounter  Prior Authorization for Opsumit has been approved.    PA# [id number] Effective dates: 03/25/24 through 03/25/25  Patients co-pay is $0  Opsumit referral sent to [facility] via efax. Patient will receive the medication from [facility]. Document scanned to chart. Will follow up on referral status.   Almarie JULIANNA Pa, CPhT

## 2024-03-27 NOTE — Plan of Care (Signed)
  Problem: Education: Goal: Knowledge of General Education information will improve Description: Including pain rating scale, medication(s)/side effects and non-pharmacologic comfort measures 03/27/2024 0933 by Gail Cathryne SAILOR, RN Outcome: Adequate for Discharge 03/27/2024 0933 by Gail Cathryne SAILOR, RN Outcome: Progressing   Problem: Health Behavior/Discharge Planning: Goal: Ability to manage health-related needs will improve Outcome: Adequate for Discharge   Problem: Clinical Measurements: Goal: Ability to maintain clinical measurements within normal limits will improve Outcome: Adequate for Discharge Goal: Will remain free from infection Outcome: Adequate for Discharge Goal: Diagnostic test results will improve Outcome: Adequate for Discharge Goal: Respiratory complications will improve Outcome: Adequate for Discharge Goal: Cardiovascular complication will be avoided Outcome: Adequate for Discharge   Problem: Activity: Goal: Risk for activity intolerance will decrease Outcome: Adequate for Discharge   Problem: Coping: Goal: Level of anxiety will decrease Outcome: Adequate for Discharge   Problem: Elimination: Goal: Will not experience complications related to bowel motility Outcome: Adequate for Discharge   Problem: Safety: Goal: Ability to remain free from injury will improve Outcome: Adequate for Discharge   Problem: Education: Goal: Understanding of CV disease, CV risk reduction, and recovery process will improve Outcome: Adequate for Discharge Goal: Individualized Educational Video(s) Outcome: Adequate for Discharge   Problem: Activity: Goal: Ability to return to baseline activity level will improve Outcome: Adequate for Discharge   Problem: Cardiovascular: Goal: Ability to achieve and maintain adequate cardiovascular perfusion will improve Outcome: Adequate for Discharge Goal: Vascular access site(s) Level 0-1 will be maintained Outcome: Adequate for  Discharge   Problem: Health Behavior/Discharge Planning: Goal: Ability to safely manage health-related needs after discharge will improve Outcome: Adequate for Discharge

## 2024-03-30 ENCOUNTER — Other Ambulatory Visit (HOSPITAL_COMMUNITY): Payer: Self-pay

## 2024-03-30 NOTE — Telephone Encounter (Signed)
 Advanced Heart Failure Patient Advocate Encounter  Prior Authorization for Tadalafil  has been approved.    PA# [id number] Effective dates: 03/25/24 through 03/25/25  Patients co-pay is $0 (insurance limits to 30 days)  Almarie JULIANNA Pa, CPhT

## 2024-03-31 NOTE — Telephone Encounter (Signed)
 Multiple reminders to patient. Labs never drawn.

## 2024-04-01 NOTE — Progress Notes (Signed)
 ADVANCED HF CLINIC CONSULT NOTE  Referring Physician: Melba Lamarr BRAVO, NP Primary Care: Melba Lamarr BRAVO, NP HF Cardiologist: Dr. Rolan  HPI: 48 y.o. female with history of tobacco abuse, former cocaine, daily meth for 1 year. No previous cardiac history. No family history of pulmonary hypertension.    Seen in ED 6/25 for lower extremity edema. UDS + Cocaine + Amphetamines. BNP 6000. She was told she had heart failure and a mass on her liver. She declined admit. She elected to follow up with PCP, who referred to CI and Cards. Back in ED 7/25 with atypical chest pain. CTA negative. She had noticed progressive shortness of breath and presyncope on a daily basis. Discharged with cardiology follow up.    On 03/20/24 she had liver biopsy. Then 8/4 underwent treadmill stress test and but had to stop due to chest pain and presyncope. She was sent to ED and was admitted. Echo showed EF 60-65%, severe RV enlargement and severe RV dysfunction, severe RAE, LV compressed by RV, small pericardial effusion. CTA and V/Q scan negative for PE/parenchymal disease. LHC without significant CAD. RHC showed normal filling pressures with mod to severe pulmonary hypertension and low cardiac output. Discharged on tadalafil  and midodrine .    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 Overall feeling well. Improved PAH WHO class I. Reports that she has developed BLE edema, has started taking her PRN lasix  40 mg daily for the last 3 days. Denies chest pain, dyspnea, fatigue, palpitations, and dizziness. Able to perform ADLs. 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 Appetite okay. Does not take BP at home but will start. Compliant with all medications. Received her Opsumit  in the mail yesterday, just took first pill today.   Past Medical History:  Diagnosis Date   Diabetes mellitus without complication (HCC)    prediabetes   Heart failure (HCC)    Hypertension     Current Outpatient Medications  Medication Sig Dispense Refill   digoxin  (LANOXIN ) 0.125 MG tablet Take 1 tablet (0.125 mg total) by mouth daily. 30 tablet 0   EPINEPHrine  0.3 mg/0.3 mL IJ SOAJ injection Inject 0.3 mg into the muscle as needed for anaphylaxis. 1 each 0   furosemide  (LASIX ) 20 MG tablet Take 1 tablet (20 mg total) by mouth daily as needed for edema or fluid (leg swelling, weight gain 2 to 3 lbs in 24 hrs or 5 lbs in 7 days.). (Patient taking differently: Take 40 mg by mouth daily.) 30 tablet 0   macitentan  (OPSUMIT ) 10 MG tablet Take 1 tablet (10 mg total) by mouth daily.     midodrine  (PROAMATINE ) 10 MG tablet Take 1 tablet (10 mg total) by mouth 3 (three) times daily with meals. 90 tablet 0   potassium chloride  (KLOR-CON  M) 10 MEQ tablet Take 1 tablet (10 mEq total) by mouth daily as needed (take when taking furosemide ). 30 tablet 0   tadalafil , PAH, (ADCIRCA ) 20 MG tablet Take 2 tablets (40 mg total) by mouth daily. 30 tablet 0   No current facility-administered medications for this encounter.   No Known Allergies   Social History   Socioeconomic History   Marital status: Single    Spouse name: Not on file   Number of children: Not on file   Years of education: Not on file   Highest education level: Not on file  Occupational History   Not on file  Tobacco Use   Smoking status: Every  Day    Current packs/day: 1.00    Average packs/day: 1 pack/day for 20.0 years (20.0 ttl pk-yrs)    Types: Cigarettes   Smokeless tobacco: Never  Vaping Use   Vaping status: Former  Substance and Sexual Activity   Alcohol use: Yes    Comment: every other day   Drug use: Not Currently    Types: Cocaine    Comment: pills   Sexual activity: Yes    Birth control/protection: None  Other Topics Concern   Not on file  Social History Narrative   Not on file   Social Drivers of Health   Financial Resource Strain: Not on file  Food Insecurity: No Food Insecurity (03/23/2024)    Hunger Vital Sign    Worried About Running Out of Food in the Last Year: Never true    Ran Out of Food in the Last Year: Never true  Recent Concern: Food Insecurity - Medium Risk (03/12/2024)   Received from [facility]   Hunger Vital Sign    Within the past 12 months, you worried that your food would run out before you got money to buy more: Never true    Within the past 12 months, the food you bought just didn't last and you didn't have money to get more. : Sometimes true  Transportation Needs: No Transportation Needs (03/23/2024)   PRAPARE - Administrator, Civil Service (Medical): No    Lack of Transportation (Non-Medical): No  Physical Activity: Not on file  Stress: Not on file  Social Connections: Not on file  Intimate Partner Violence: Not At Risk (03/23/2024)   Humiliation, Afraid, Rape, and Kick questionnaire    Fear of Current or Ex-Partner: No    Emotionally Abused: No    Physically Abused: No    Sexually Abused: No   History reviewed. No pertinent family history.  Blood pressure 124/80, pulse 97, height 5' (1.524 m), weight 57.9 kg (127 lb 9.6 oz), SpO2 97%.  Filed Weights   04/03/24 1043  Weight: 57.9 kg (127 lb 9.6 oz)   PHYSICAL EXAM: General: Well appearing. No distress on RA Cardiac: JVP ~8cm. S1 and S2 present. No murmurs or rub. Extremities: Warm and dry.  BLE 1+ edema.  Neuro: Alert and oriented x3. Affect pleasant. Moves all extremities without difficulty.  ECG (personally reviewed): NSR 96 bpm with BAE  ASSESSMENT & PLAN: 1. Pulmonary Hypertension with Cor Pulmonale: Echo 8/25 with EF 60-65%, severe RAE/RVE and severe RV dysfunction, LV compressed by RV, small pericardial effusion. ECG shows RVH. RHC showed sev pulmonary hypertension/RV failure with CI 1.65. CTA negative for PE and minimal parenchymal disease. V/Q scan normal. Rheumatologic serologies negative. Recent liver mass noted, biopsy showed severe fibrosis (stage 3) and portal vein  obstruction, possible porto-pulmonary hypertension. She uses amphetamines and cocaine regularly (daily over last year), both can be causes of pulmonary hypertension. Has not used since prior to admission - PAH WHO Class I-II. Volume up on exam.  - Started taking PRN lasix  daily 3 days ago. Continue Lasix  40 mg daily - compression socks ordered. Needs to wear, especially while working long hours on her feet - continue digoxin  0.125 mg daily - decrease midodrine  to 5mg  tid, would like to eventually wean off.  - continue adcirca  40 mg daily - continue opsumit  10 mg daily, just received has taken 1 dose - With low output, will attempt to start prostacyclin receptor agonist quickly. Discussed with HF PharmD, will start PA for  selexipag on Monday.  - Imperative to stop amphetamines/cocaine. We discussed this.  - Low cardiac output in setting of severe PAH shows increased risk. We should consider IV prostacyclin agonist in future but for continuous IV infusion she is going to have to show that she can stay off amphetamine/cocaine. 2. Liver Fibrosis: Patient was found incidentally by CT to have liver mass but not cirrhosis, doubt porto-pulmonary - S/p liver biopsy, showing inflammation and focal bridging fibrosis (stage 3), sinusoidal dilation suggestive of venous outflow obstruction.  - she has been referred to transplant hepatology 3. Smoking: Encouraged her to quit.  4. Polysubstance Abuse: Has not used since prior to admission.   Follow up in 2 weeks with PharmD, then 4 weeks with Dr. McLean  [place] Teagyn Fishel, NP 04/03/24

## 2024-04-02 ENCOUNTER — Telehealth (HOSPITAL_COMMUNITY): Payer: Self-pay

## 2024-04-02 NOTE — Telephone Encounter (Signed)
 Called to confirm/remind patient of their appointment at the [facility] Clinic on 04/03/24.   Appointment:   [x] Confirmed  [] Left mess   [] No answer/No voice mail  [] VM Full/unable to leave message  [] Phone not in service  Patient reminded to bring all medications and/or complete list.  Confirmed patient has transportation. Gave directions, instructed to utilize valet parking.

## 2024-04-02 NOTE — Telephone Encounter (Signed)
 Advanced Heart Failure Patient Advocate Encounter  Received notification from [employer] that Opsumit  free trial was shipped 08/13.

## 2024-04-03 ENCOUNTER — Other Ambulatory Visit (HOSPITAL_COMMUNITY): Payer: Self-pay | Admitting: Pharmacist

## 2024-04-03 ENCOUNTER — Encounter (HOSPITAL_COMMUNITY): Payer: Self-pay

## 2024-04-03 ENCOUNTER — Ambulatory Visit (HOSPITAL_COMMUNITY)
Admit: 2024-04-03 | Discharge: 2024-04-03 | Disposition: A | Discharge status: Home / Self Care | Attending: Cardiology | Admitting: Cardiology

## 2024-04-03 ENCOUNTER — Ambulatory Visit (HOSPITAL_COMMUNITY): Payer: Self-pay | Admitting: Cardiology

## 2024-04-03 VITALS — BP 124/80 | HR 97 | Ht 60.0 in | Wt 127.6 lb

## 2024-04-03 DIAGNOSIS — I272 Pulmonary hypertension, unspecified: Secondary | ICD-10-CM | POA: Diagnosis present

## 2024-04-03 DIAGNOSIS — I2781 Cor pulmonale (chronic): Secondary | ICD-10-CM | POA: Diagnosis not present

## 2024-04-03 DIAGNOSIS — F17210 Nicotine dependence, cigarettes, uncomplicated: Secondary | ICD-10-CM | POA: Diagnosis not present

## 2024-04-03 DIAGNOSIS — F1911 Other psychoactive substance abuse, in remission: Secondary | ICD-10-CM | POA: Insufficient documentation

## 2024-04-03 DIAGNOSIS — Z87891 Personal history of nicotine dependence: Secondary | ICD-10-CM

## 2024-04-03 DIAGNOSIS — F1910 Other psychoactive substance abuse, uncomplicated: Secondary | ICD-10-CM | POA: Diagnosis not present

## 2024-04-03 DIAGNOSIS — K7400 Hepatic fibrosis, unspecified: Secondary | ICD-10-CM | POA: Diagnosis not present

## 2024-04-03 DIAGNOSIS — Z79899 Other long term (current) drug therapy: Secondary | ICD-10-CM | POA: Diagnosis not present

## 2024-04-03 DIAGNOSIS — I509 Heart failure, unspecified: Secondary | ICD-10-CM | POA: Diagnosis not present

## 2024-04-03 DIAGNOSIS — I5042 Chronic combined systolic (congestive) and diastolic (congestive) heart failure: Secondary | ICD-10-CM | POA: Insufficient documentation

## 2024-04-03 LAB — BASIC METABOLIC PANEL WITH GFR
Anion gap: 7 (ref 5–15)
BUN: 17 mg/dL (ref 6–20)
CO2: 27 mmol/L (ref 22–32)
Calcium: 9.2 mg/dL (ref 8.9–10.3)
Chloride: 104 mmol/L (ref 98–111)
Creatinine, Ser: 0.99 mg/dL (ref 0.44–1.00)
GFR, Estimated: >60 mL/min (ref >60)
Glucose, Bld: 71 mg/dL (ref 70–99)
Potassium: 3.8 mmol/L (ref 3.5–5.1)
Sodium: 138 mmol/L (ref 135–145)

## 2024-04-03 LAB — BRAIN NATRIURETIC PEPTIDE: B Natriuretic Peptide: 773.1 pg/mL — ABNORMAL HIGH (ref 0.0–100.0)

## 2024-04-03 MED ORDER — OPSUMIT 10 MG PO TABS: 10.0000 mg | ORAL_TABLET | Freq: Every day | ORAL | Status: DC

## 2024-04-03 MED ORDER — MIDODRINE HCL 10 MG PO TABS: 5.0000 mg | ORAL_TABLET | Freq: Three times a day (TID) | ORAL | 11 refills | Status: DC

## 2024-04-03 MED ORDER — FUROSEMIDE 20 MG PO TABS
40.0000 mg | ORAL_TABLET | Freq: Every day | ORAL | 11 refills | Status: AC
  Filled 2024-09-04: qty 60, 30d supply, fill #0

## 2024-04-03 NOTE — Patient Instructions (Addendum)
 Thank you for coming in today  If you had labs drawn today, any labs that are abnormal the clinic will call you No news is good news  You have been given a prescription for compression hose. And a list of places who supply them. Please wear your compression hose daily, place them on as soon as you get up in the morning and remove before you go to bed at night.  PLEASE CHECK YOUR BLOOD PRESSURE DAILY  Medications: Increase Lasix  to 40 mg 2 tablets daily Decrease Midodrine  to 5 mg 1/2 tablet 3 times daily  Follow up appointments:  Your physician recommends that you schedule a follow-up appointment in:  2 weeks with Pharmacy  1 month With Dr. Rolan    Do the following things EVERYDAY: Weigh yourself in the morning before breakfast. Write it down and keep it in a log. Take your medicines as prescribed Eat low salt foods--Limit salt (sodium) to 2000 mg per day.  Stay as active as you can everyday Limit all fluids for the day to less than 2 liters   At the [facility], you and your health needs are our priority. As part of our continuing mission to provide you with exceptional heart care, we have created designated Provider Care Teams. These Care Teams include your primary Cardiologist (physician) and Advanced Practice Providers (APPs- Physician Assistants and Nurse Practitioners) who all work together to provide you with the care you need, when you need it.   You may see any of the following providers on your designated Care Team at your next follow up: Dr Toribio Fuel Dr Ezra Rolan Dr. Ria Gardenia Greig Lenetta, NP Caffie Shed, [place] [facility] [place], [place] Beckey Coe, NP Tinnie Redman, PharmD   Please be sure to bring in all your medications bottles to every appointment.    Thank you for choosing [facility]  If you have any questions or concerns before your next appointment please send us  a  message through [place] or call our office at [phone].    TO LEAVE A MESSAGE FOR THE NURSE SELECT OPTION 2, PLEASE LEAVE A MESSAGE INCLUDING: YOUR NAME DATE OF BIRTH CALL BACK NUMBER REASON FOR CALL**this is important as we prioritize the call backs  YOU WILL RECEIVE A CALL BACK THE SAME DAY AS LONG AS YOU CALL BEFORE 4:00 PM

## 2024-04-07 ENCOUNTER — Other Ambulatory Visit (HOSPITAL_BASED_OUTPATIENT_CLINIC_OR_DEPARTMENT_OTHER): Payer: Self-pay

## 2024-04-07 ENCOUNTER — Other Ambulatory Visit: Payer: Self-pay

## 2024-04-07 ENCOUNTER — Other Ambulatory Visit (HOSPITAL_COMMUNITY): Payer: Self-pay

## 2024-04-08 ENCOUNTER — Telehealth (HOSPITAL_COMMUNITY): Payer: Self-pay | Admitting: Pharmacist

## 2024-04-08 ENCOUNTER — Other Ambulatory Visit (HOSPITAL_COMMUNITY): Payer: Self-pay

## 2024-04-08 NOTE — Telephone Encounter (Signed)
 Patient Advocate Encounter   Received notification from [facility] that prior authorization for Uptravi is required.   PA submitted on CoverMyMeds Key [id number] Status is pending   Will continue to follow.   Tinnie Redman, PharmD, BCPS, BCCP, CPP Heart Failure Clinic Pharmacist [phone]

## 2024-04-08 NOTE — Progress Notes (Incomplete)
 ***In Progress***    Advanced Heart Failure Clinic Note   Referring Physician: Melba Lamarr BRAVO, NP Primary Care: Melba Lamarr BRAVO, NP HF Cardiologist: Dr. Rolan    HPI:  48 y.o. female with history of tobacco abuse, former cocaine, daily meth for 1 year. No previous cardiac history. No family history of pulmonary hypertension.    Seen in ED 01/2024 for lower extremity edema. UDS + Cocaine + Amphetamines. BNP 6000. She was told she had heart failure and a mass on her liver. She declined admit. She elected to follow up with PCP, who referred to CI and Cards. Back in ED 02/2024 with atypical chest pain. CTA negative. She had noticed progressive shortness of breath and presyncope on a daily basis. Discharged with cardiology follow up.    On 03/20/24 she had liver biopsy. Then 03/23/24 underwent treadmill stress test and but had to stop due to chest pain and presyncope. She was sent to ED and was admitted. Echo showed EF 60-65%, severe RV enlargement and severe RV dysfunction, severe RAE, LV compressed by RV, small pericardial effusion. CTA and V/Q scan negative for PE/parenchymal disease. LHC without significant CAD. RHC showed normal filling pressures with mod to severe pulmonary hypertension and low cardiac output. Discharged on tadalafil  and midodrine .    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 Overall was feeling well. Improved PAH WHO class I. Reported that she had developed BLE edema, had started taking her PRN Lasix  40 mg daily for the last 3 days. Denied chest pain, dyspnea, fatigue, palpitations, and dizziness. Able to perform ADLs. 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 Reported that she does not take BP at home but will start. Compliant with all medications. Received her Opsumit  in the mail the day prior to clinic, just took first pill 04/03/24.   Today she returns to HF clinic for pharmacist  medication titration. At last visit with APP, she was instructed to increase Lasix  to 40 mg daily and decrease midodrine  to 5 mg TID. Process to start Uptravi was initiated.   Today he returns to HF clinic for pharmacist medication titration. At last visit with MD ***.   Overall feeling ***. Dizziness, lightheadedness, fatigue:  Chest pain or palpitations:  How is your breathing?: *** SOB: Able to complete all ADLs. Activity level ***  Weight at home pounds. Takes furosemide /torsemide/bumex *** mg *** daily.  LEE PND/Orthopnea  Appetite *** Low-salt diet:   Physical Exam Cost/affordability of meds  -no labs vs bmet/bnp since Lsx inc to daily last visit -get uptravi paperwork signed -[facility]  shipment: Yes, we did receive the opsumit  referral. I have placed a request for the pharmacy to reach out to schedule with patient. Copay is $0. Patient can call to schedule at [phone].  -Wean midodrine  - 2.5 mg TID x1 week then stop, monitor BP at home if SBP <90 or sx of HOTN r/s -DM 10/8   Shortness of breath/dyspnea on exertion? {YES NO:22349}  Orthopnea/PND? {YES NO:22349} Edema? {YES NO:22349} Lightheadedness/dizziness? {YES NO:22349} Daily weights at home? {YES NO:22349} Blood pressure/heart rate monitoring at home? {YES [id number] Following low-sodium/fluid-restricted diet? {YES NO:22349}  HF Medications:   Has the patient been experiencing any side effects to the medications prescribed?  {YES NO:22349}  Does the patient have any problems obtaining medications due to transportation or finances?   {YES NO:22349}  Understanding of regimen: {excellent/good/fair/poor:19665}  Understanding of indications: {excellent/good/fair/poor:19665} Potential of compliance: {excellent/good/fair/poor:19665} Patient understands to avoid NSAIDs. Patient understands to avoid decongestants.    Pertinent Lab Values: Serum creatinine ***, BUN ***, Potassium ***,  Sodium ***, BNP ***, Magnesium ***, Digoxin  ***   Vital Signs: Weight: *** (last clinic weight: ***) Blood pressure: ***  Heart rate: ***   Assessment/Plan: 1. Pulmonary Hypertension with Cor Pulmonale: Echo 03/2024 with EF 60-65%, severe RAE/RVE and severe RV dysfunction, LV compressed by RV, small pericardial effusion. ECG shows RVH. RHC showed sev pulmonary hypertension/RV failure with CI 1.65. CTA negative for PE and minimal parenchymal disease. V/Q scan normal. Rheumatologic serologies negative. 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 She uses amphetamines and cocaine regularly (daily over last year), both can be causes of pulmonary hypertension. Has not used since prior to admission - PAH WHO Group 1; WHO Class I-II symptoms. Volume up on exam. *** - Continue Lasix  40 mg daily - Compression socks ordered last visit. Needs to wear, especially while working long hours on her feet - Continue digoxin  0.125 mg daily - Continue midodrine  5 mg TID, would like to eventually wean off.  - Continue tadalafil  40 mg daily - Continue Opsumit  10 mg daily - With low output, will attempt to start prostacyclin receptor agonist quickly. Deneise PA approved. Had been trying to get enrollment application signed but had difficulty getting this signed. She signed in clinic today and application faxed to hub. [facility]. *** - Imperative to stop amphetamines/cocaine. We discussed this.  - Low cardiac output in setting of severe PAH shows increased risk. We should consider IV prostacyclin agonist in future but for continuous IV infusion she is going to have to show that she can stay off amphetamine/cocaine. 2. Liver Fibrosis: Patient was found incidentally by CT to have liver mass but not cirrhosis, doubt porto-pulmonary - S/p liver biopsy, showing inflammation and focal bridging  fibrosis (stage 3), sinusoidal dilation suggestive of venous outflow obstruction.  - she has been referred to transplant hepatology 3. Smoking: Encouraged her to quit.  4. Polysubstance Abuse: Has not used since prior to admission.    Follow up ***   Tinnie Redman, PharmD, BCPS, BCCP, CPP Heart Failure Clinic Pharmacist [phone]

## 2024-04-10 ENCOUNTER — Encounter (HOSPITAL_COMMUNITY): Payer: Self-pay | Admitting: Cardiology

## 2024-04-13 ENCOUNTER — Telehealth (HOSPITAL_COMMUNITY): Payer: Self-pay | Admitting: Pharmacy Technician

## 2024-04-13 ENCOUNTER — Telehealth (HOSPITAL_COMMUNITY): Payer: Self-pay | Admitting: Cardiology

## 2024-04-13 MED ORDER — TADALAFIL (PAH) 20 MG PO TABS: 40.0000 mg | ORAL_TABLET | Freq: Every day | ORAL | 6 refills | Status: DC

## 2024-04-13 MED ORDER — POTASSIUM CHLORIDE CRYS ER 10 MEQ PO TBCR: 10.0000 meq | EXTENDED_RELEASE_TABLET | Freq: Every day | ORAL | 6 refills | Status: DC | PRN

## 2024-04-13 MED ORDER — DIGOXIN 125 MCG PO TABS: 0.1250 mg | ORAL_TABLET | Freq: Every day | ORAL | 6 refills | Status: AC

## 2024-04-13 NOTE — Telephone Encounter (Signed)
 Refills done.

## 2024-04-13 NOTE — Telephone Encounter (Signed)
 Advanced Heart Failure Patient Advocate Encounter  Prior Authorization for Nancy [name] has been approved.    PA# [id number] Effective dates: 04/08/24 through 04/08/25  Tinnie Redman, PharmD, BCPS, BCCP, CPP Heart Failure Clinic Pharmacist [phone]

## 2024-04-13 NOTE — Telephone Encounter (Signed)
 Advanced Heart Failure Patient Advocate Encounter  Jerel Ouch referral to patient via docusign for signature.  Will fax in once all signatures are obtained.

## 2024-04-14 ENCOUNTER — Other Ambulatory Visit: Payer: Self-pay

## 2024-04-14 ENCOUNTER — Encounter (HOSPITAL_COMMUNITY): Payer: Self-pay

## 2024-04-14 ENCOUNTER — Other Ambulatory Visit (HOSPITAL_COMMUNITY): Payer: Self-pay | Admitting: Pharmacist

## 2024-04-14 ENCOUNTER — Other Ambulatory Visit (HOSPITAL_COMMUNITY): Payer: Self-pay

## 2024-04-14 MED ORDER — TADALAFIL (PAH) 20 MG PO TABS
40.0000 mg | ORAL_TABLET | Freq: Every day | ORAL | 6 refills | Status: AC
  Filled 2024-04-14 (×2): qty 60, 30d supply, fill #0
  Filled 2024-05-10: qty 60, 30d supply, fill #1
  Filled 2024-06-08: qty 60, 30d supply, fill #2
  Filled 2024-07-08: qty 60, 30d supply, fill #3
  Filled 2024-08-22: qty 60, 30d supply, fill #4

## 2024-04-15 NOTE — Telephone Encounter (Signed)
 Advanced Heart Failure Patient Advocate Encounter  Received notification that [facility] is out of network and sent the referral back to the hub on 08/19. [facility] has yet to receive the referral.  Called [employer], they did receive the referral back and will [facility].

## 2024-04-17 NOTE — Telephone Encounter (Signed)
 Advanced Heart Failure Patient Advocate Encounter  Per [facility], the referral has not been received. Called [facility] [facility]. The representative stated that she would send it [facility].   Will follow up.

## 2024-04-21 ENCOUNTER — Other Ambulatory Visit (HOSPITAL_COMMUNITY): Payer: Self-pay

## 2024-04-21 ENCOUNTER — Ambulatory Visit

## 2024-04-21 ENCOUNTER — Other Ambulatory Visit: Payer: Self-pay

## 2024-04-22 ENCOUNTER — Other Ambulatory Visit (HOSPITAL_COMMUNITY)

## 2024-04-22 ENCOUNTER — Encounter (HOSPITAL_COMMUNITY): Payer: Self-pay

## 2024-04-22 NOTE — Telephone Encounter (Signed)
 Advanced Heart Failure Patient Advocate Encounter  Per [facility], referral has been received. Will follow up to ensure they have made contact with the patient who is aware [facility] will be calling.

## 2024-04-27 NOTE — Telephone Encounter (Signed)
 Spoke to patient by phone, she was not aware any other specialty pharmacy medications were being started. Pt is calling to confirm first shipment of Opsumit  today, and will discuss Uptravi further with MD at next appointment. I have added an appointment note to reach out to me if pt will be starting Uptravi so that enrollment forms can be started at that time.  Rachel DEL, CPhT Rx Patient Advocate Phone: [phone]

## 2024-04-27 NOTE — Telephone Encounter (Signed)
 Attempted to contact patient regarding Uptravi. No answer and voice mail box is not set up. Will try again

## 2024-04-27 NOTE — Telephone Encounter (Signed)
 Attempted to contact patient regarding Opsumit . No answer and voice mail box is not set up. Will try again

## 2024-04-27 NOTE — Telephone Encounter (Signed)
 Spoke to patient regarding Opsumit . Patient has just spoken with [facility], and will call [facility] to confirm delivery. Provided phone number for [facility]. Nothing further needed at this time.

## 2024-05-08 NOTE — Telephone Encounter (Signed)
 [facility] confirmed patient received medication on 05/05/24.

## 2024-05-11 ENCOUNTER — Other Ambulatory Visit: Payer: Self-pay

## 2024-05-12 ENCOUNTER — Other Ambulatory Visit (HOSPITAL_COMMUNITY): Payer: Self-pay

## 2024-05-14 ENCOUNTER — Other Ambulatory Visit (HOSPITAL_COMMUNITY): Payer: Self-pay

## 2024-05-14 ENCOUNTER — Encounter: Payer: Self-pay | Admitting: Nurse Practitioner

## 2024-05-14 ENCOUNTER — Encounter (HOSPITAL_COMMUNITY): Payer: Self-pay

## 2024-05-14 DIAGNOSIS — Z1231 Encounter for screening mammogram for malignant neoplasm of breast: Secondary | ICD-10-CM

## 2024-05-21 ENCOUNTER — Other Ambulatory Visit (HOSPITAL_COMMUNITY): Payer: Self-pay

## 2024-05-27 ENCOUNTER — Ambulatory Visit (HOSPITAL_COMMUNITY): Payer: Self-pay | Admitting: Cardiology

## 2024-05-27 ENCOUNTER — Ambulatory Visit (HOSPITAL_COMMUNITY)
Admission: RE | Admit: 2024-05-27 | Discharge: 2024-05-27 | Disposition: A | Discharge status: Home / Self Care | Source: Ambulatory Visit | Attending: Cardiology | Admitting: Cardiology

## 2024-05-27 ENCOUNTER — Telehealth (HOSPITAL_COMMUNITY): Payer: Self-pay

## 2024-05-27 ENCOUNTER — Encounter (HOSPITAL_COMMUNITY): Payer: Self-pay | Admitting: Cardiology

## 2024-05-27 VITALS — BP 102/60 | HR 86 | Wt 129.6 lb

## 2024-05-27 DIAGNOSIS — I5042 Chronic combined systolic (congestive) and diastolic (congestive) heart failure: Secondary | ICD-10-CM

## 2024-05-27 DIAGNOSIS — Z79899 Other long term (current) drug therapy: Secondary | ICD-10-CM | POA: Insufficient documentation

## 2024-05-27 DIAGNOSIS — R9431 Abnormal electrocardiogram [ECG] [EKG]: Secondary | ICD-10-CM | POA: Insufficient documentation

## 2024-05-27 DIAGNOSIS — I2721 Secondary pulmonary arterial hypertension: Secondary | ICD-10-CM | POA: Diagnosis not present

## 2024-05-27 DIAGNOSIS — F1511 Other stimulant abuse, in remission: Secondary | ICD-10-CM | POA: Insufficient documentation

## 2024-05-27 DIAGNOSIS — I517 Cardiomegaly: Secondary | ICD-10-CM | POA: Diagnosis not present

## 2024-05-27 DIAGNOSIS — I272 Pulmonary hypertension, unspecified: Secondary | ICD-10-CM

## 2024-05-27 DIAGNOSIS — Z5941 Food insecurity: Secondary | ICD-10-CM | POA: Insufficient documentation

## 2024-05-27 DIAGNOSIS — I4510 Unspecified right bundle-branch block: Secondary | ICD-10-CM | POA: Diagnosis not present

## 2024-05-27 DIAGNOSIS — K7400 Hepatic fibrosis, unspecified: Secondary | ICD-10-CM | POA: Diagnosis not present

## 2024-05-27 DIAGNOSIS — F1411 Cocaine abuse, in remission: Secondary | ICD-10-CM | POA: Insufficient documentation

## 2024-05-27 DIAGNOSIS — F17210 Nicotine dependence, cigarettes, uncomplicated: Secondary | ICD-10-CM | POA: Diagnosis not present

## 2024-05-27 LAB — BASIC METABOLIC PANEL WITH GFR
Anion gap: 8 (ref 5–15)
BUN: 13 mg/dL (ref 6–20)
CO2: 28 mmol/L (ref 22–32)
Calcium: 8.8 mg/dL — ABNORMAL LOW (ref 8.9–10.3)
Chloride: 102 mmol/L (ref 98–111)
Creatinine, Ser: 0.92 mg/dL (ref 0.44–1.00)
GFR, Estimated: >60 mL/min (ref >60)
Glucose, Bld: 68 mg/dL — ABNORMAL LOW (ref 70–99)
Potassium: 3.5 mmol/L (ref 3.5–5.1)
Sodium: 138 mmol/L (ref 135–145)

## 2024-05-27 LAB — DIGOXIN LEVEL: Digoxin Level: 0.9 ng/mL (ref 0.8–2.0)

## 2024-05-27 LAB — BRAIN NATRIURETIC PEPTIDE: B Natriuretic Peptide: 100.2 pg/mL — ABNORMAL HIGH (ref 0.0–100.0)

## 2024-05-27 MED ORDER — VARENICLINE TARTRATE (STARTER) 0.5 MG X 11 & 1 MG X 42 PO TBPK: ORAL_TABLET | ORAL | 1 refills | Status: AC

## 2024-05-27 MED ORDER — OPSUMIT 10 MG PO TABS: 10.0000 mg | ORAL_TABLET | Freq: Every day | ORAL | 11 refills | Status: AC

## 2024-05-27 NOTE — Patient Instructions (Addendum)
 STOP Midodrine   START Chantix as directed on the box.  START Uptravi once you receive it   Labs done today, your results will be available in MyChart, we will contact you for abnormal readings.  Your provider has ordered lung function test for you. You will be called to have this test arranged.  Your physician recommends that you schedule a follow-up appointment in: 2 months.  If you have any questions or concerns before your next appointment please send us  a message through [place] or call our office at [phone].    TO LEAVE A MESSAGE FOR THE NURSE SELECT OPTION 2, PLEASE LEAVE A MESSAGE INCLUDING: YOUR NAME DATE OF BIRTH CALL BACK NUMBER REASON FOR CALL**this is important as we prioritize the call backs  YOU WILL RECEIVE A CALL BACK THE SAME DAY AS LONG AS YOU CALL BEFORE 4:00 PM  At the [facility], you and your health needs are our priority. As part of our continuing mission to provide you with exceptional heart care, we have created designated Provider Care Teams. These Care Teams include your primary Cardiologist (physician) and Advanced Practice Providers (APPs- Physician Assistants and Nurse Practitioners) who all work together to provide you with the care you need, when you need it.   You may see any of the following providers on your designated Care Team at your next follow up: Dr Toribio Fuel Dr Ezra Shuck Dr. Ria Commander Dr. Morene Brownie Amy Lenetta, NP Caffie Shed, [place] [facility] [place], [place] Beckey Coe, NP [place] Lee, NP Ellouise Class, NP Tinnie Redman, PharmD Jaun Bash, PharmD   Please be sure to bring in all your medications bottles to every appointment.    Thank you for choosing [facility]

## 2024-05-27 NOTE — Progress Notes (Signed)
 ADVANCED HF CLINIC CONSULT NOTE  Primary Care: Melba Lamarr BRAVO, NP HF Cardiologist: Dr. Rolan  HPI: 48 y.o. female with history of tobacco, cocaine and methamphetamine abuse.     Seen in ED 6/25 for lower extremity edema. UDS + Cocaine + Amphetamines. BNP 6000. She was told she had heart failure and a mass on her liver. She declined admit. Back in ED 7/25 with atypical chest pain. CTA chest negative for PE. She had noticed progressive shortness of breath and presyncope on a daily basis. Discharged with cardiology follow up.    On 03/20/24 she had liver biopsy. Then 03/23/24 underwent treadmill stress test and but had to stop due to chest pain and presyncope. She was sent to ED and was admitted. Echo showed EF 60-65%, severe RV enlargement and severe RV dysfunction, severe RAE, LV compressed by RV, small pericardial effusion. CTA chest and V/Q scan negative for PE/parenchymal disease. LHC without significant CAD. RHC showed normal filling pressures with mod to severe pulmonary arterial hypertension and low cardiac output. Discharged on tadalafil  and midodrine .    She returns today for followup of pulmonary hypertension/RV failure. She is breathing better on Opsumit  + tadalafil .  She has been walking with her mother for exercise without exertional dyspnea.  This is a significant improvement.  She gets short of breath only if she tries to jog.  She has not been taking midodrine  and BP has been ok with no lightheadedness.  She is still smoking but has not used cocaine or methamphetamine since hospital discharge.  No orthopnea/PND.  No chest pain. 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   ECG (personally reviewed): NSR, right axis deviation, incomplete RBBB  Labs (8/25): BNP 773, K 3.8, creatinine 0.99, HIV negative, ANA negative, CCP negative, SCL-70 negative.   6 minute walk (10/25): 457 m  PMH: 1. Cocaine abuse 2. Methamphetamine abuse 3. Active smoker.  4. Pulmonary hypertension/RV failure:  -  Echo (8/25): EF 60-65%, severe RV enlargement/severely decreased RV function, small pericardial effusion.  - V/Q scan (8/25): No chronic PE - CTA chest (8/25): No acute PE, no ILD, hepatic mass noted left liver lobe.  - LHC/RHC (8/25): No significant CAD; mean RA 6, PA 65/40 mean 49, mean PCWP 2, PVR 18.8, CI 1.65, no step-up in oxygen saturation from SVC to PA.  5. Liver mass: Left liver lobe.   - Biopsy showed focal bridging fibrosis, sinusoidal obstruction suggestive of hepatic venous outflow obstruction.   FH: No history of pulmonary hypertension or cardiomyopathy.   Current Outpatient Medications  Medication Sig Dispense Refill   digoxin  (LANOXIN ) 0.125 MG tablet Take 1 tablet (0.125 mg total) by mouth daily. 30 tablet 6   EPINEPHrine  0.3 mg/0.3 mL IJ SOAJ injection Inject 0.3 mg into the muscle as needed for anaphylaxis. 1 each 0   furosemide  (LASIX ) 20 MG tablet Take 2 tablets (40 mg total) by mouth daily. 60 tablet 11   potassium chloride  (KLOR-CON  M) 10 MEQ tablet Take 1 tablet (10 mEq total) by mouth daily as needed (take when taking furosemide ). 30 tablet 6   tadalafil , PAH, (ADCIRCA ) 20 MG tablet Take 2 tablets (40 mg total) by mouth daily. 60 tablet 6   Varenicline Tartrate, Starter, (CHANTIX STARTING MONTH PAK) 0.5 MG X 11 & 1 MG X 42 TBPK DAY 1-3, .5 MG DAILY, DAY 4-7 .5 mg Twice daily day 8 onwards 1 mg Twice daily 53 each 1   macitentan  (OPSUMIT ) 10 MG tablet Take 1  tablet (10 mg total) by mouth daily. 30 tablet 11   No current facility-administered medications for this encounter.   No Known Allergies   Social History   Socioeconomic History   Marital status: Single    Spouse name: Not on file   Number of children: Not on file   Years of education: Not on file   Highest education level: Not on file  Occupational History   Not on file  Tobacco Use   Smoking status: Every Day    Current packs/day: 1.00    Average packs/day: 1 pack/day for 20.0 years (20.0 ttl  pk-yrs)    Types: Cigarettes   Smokeless tobacco: Never  Vaping Use   Vaping status: Former  Substance and Sexual Activity   Alcohol use: Yes    Comment: every other day   Drug use: Not Currently    Types: Cocaine    Comment: pills   Sexual activity: Yes    Birth control/protection: None  Other Topics Concern   Not on file  Social History Narrative   Not on file   Social Drivers of Health   Financial Resource Strain: Not on file  Food Insecurity: No Food Insecurity (03/23/2024)   Hunger Vital Sign    Worried About Running Out of Food in the Last Year: Never true    Ran Out of Food in the Last Year: Never true  Recent Concern: Food Insecurity - Medium Risk (03/12/2024)   Received from [facility]   Hunger Vital Sign    Within the past 12 months, you worried that your food would run out before you got money to buy more: Never true    Within the past 12 months, the food you bought just didn't last and you didn't have money to get more. : Sometimes true  Transportation Needs: No Transportation Needs (03/23/2024)   PRAPARE - Administrator, Civil Service (Medical): No    Lack of Transportation (Non-Medical): No  Physical Activity: Not on file  Stress: Not on file  Social Connections: Not on file  Intimate Partner Violence: Not At Risk (03/23/2024)   Humiliation, Afraid, Rape, and Kick questionnaire    Fear of Current or Ex-Partner: No    Emotionally Abused: No    Physically Abused: No    Sexually Abused: No    Blood pressure 102/60, pulse 86, weight 58.8 kg (129 lb 9.6 oz), SpO2 97%.  Filed Weights   05/27/24 1128  Weight: 58.8 kg (129 lb 9.6 oz)   PHYSICAL EXAM: General: NAD Neck: No JVD, no thyromegaly or thyroid nodule.  Lungs: Clear to auscultation bilaterally with normal respiratory effort. CV: Nondisplaced PMI.  Heart regular S1/S2, no S3/S4, 2/6 HSM LLSB.  No peripheral edema.  No carotid bruit.  Normal pedal pulses.  Abdomen: Soft, nontender, no  hepatosplenomegaly, no distention.  Skin: Intact without lesions or rashes.  Neurologic: Alert and oriented x 3.  Psych: Normal affect. Extremities: No clubbing or cyanosis.  HEENT: Normal.   ASSESSMENT & PLAN: 1. Pulmonary Hypertension with Cor Pulmonale: Echo 8/25 with EF 60-65%, severe RAE/RVE and severe RV dysfunction, LV compressed by RV, small pericardial effusion. ECG shows RVH. RHC showed severe pulmonary arterial hypertension with CI 1.65, PVR 18.8 WU. CTA chest negative for PE and minimal parenchymal disease (no ILD). V/Q scan with no evidence for chronic PE.  HIV negative. Rheumatologic serologies negative. 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  She had been using amphetamines and cocaine regularly (daily over last year), both can be causes of pulmonary hypertension. Has not used since prior to admission.  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  She is not volume overloaded today and is feeling considerably better symptomatically (NYHA class I-II).  She did well on 6 minute walk today.  - She needs full PFTs.  - Send anti-centromere antibody to complete rheumatologic workup.  - Continue Lasix  40 mg daily, BMET/BNP today.  - Continue digoxin  0.125 mg daily, check level today.  - She can stay off midodrine .  - Continue tadalafil  40 mg daily - Continue opsumit  10 mg daily - I will work on getting her on selexipag, start at 200 mcg bid and titrate up.  We discussed possible symptoms.  - Imperative to stay off amphetamines/cocaine. We discussed this again.  - Low cardiac output in setting of severe PAH on 8/25 RHC shows increased risk. We should consider IV prostacyclin agonist in future but for continuous IV infusion she is going to have to show that she can stay off amphetamine/cocaine. 2. Liver Fibrosis: Patient was found incidentally by CT to have liver mass but not cirrhosis, probably not portopulmonary  hypertension. S/p liver biopsy showing inflammation and focal bridging fibrosis (stage 3), sinusoidal dilation suggestive of venous outflow obstruction.  - She needs to followup with hepatology.  3. Smoking: Encouraged her to quit.  - She is going to try Chantix.  4. Polysubstance Abuse: Has not used cocaine/amphetamines since prior to admission.   Follow up in 2 months  I spent 41 minutes reviewing records, interviewing/examining patient, and managing orders.   Ezra Shuck, MD 05/27/24

## 2024-05-27 NOTE — Telephone Encounter (Signed)
 Advanced Heart Failure Patient Advocate Encounter  Uptravi Enrollment and Prescription forms faxed to [employer] on 05/27/2024.  Rachel DEL, CPhT Rx Patient Advocate Phone: [phone]

## 2024-05-27 NOTE — Progress Notes (Signed)
  6 Min Walk Test Completed  Pt ambulated 1,500 ft (457.2 m) O2 Sat ranged 96-98, Patient did not use any oxygen. HR ranged 91-110

## 2024-05-29 LAB — CENTROMERE ANTIBODIES: Centromere Ab Screen: <0.2 AI (ref 0.0–0.9)

## 2024-05-29 NOTE — Telephone Encounter (Signed)
 Received confirmation from [employer] that Enrollment form was received and they will start processing benefits investigation.

## 2024-06-08 ENCOUNTER — Other Ambulatory Visit: Payer: Self-pay

## 2024-06-10 ENCOUNTER — Ambulatory Visit (HOSPITAL_COMMUNITY)
Admission: RE | Admit: 2024-06-10 | Discharge: 2024-06-10 | Disposition: A | Discharge status: Home / Self Care | Source: Ambulatory Visit | Attending: Cardiology | Admitting: Cardiology

## 2024-06-10 DIAGNOSIS — I272 Pulmonary hypertension, unspecified: Secondary | ICD-10-CM | POA: Diagnosis present

## 2024-06-10 LAB — PULMONARY FUNCTION TEST
DL/VA % pred: 84 %
DL/VA: 3.75 ml/min/mmHg/L
DLCO unc % pred: 90 %
DLCO unc: 17.28 ml/min/mmHg
FEF 25-75 Post: 2.43 L/s
FEF 25-75 Pre: 2.47 L/s
FEF2575-%Change-Post: -1 %
FEF2575-%Pred-Post: 89 %
FEF2575-%Pred-Pre: 91 %
FEV1-%Change-Post: 0 %
FEV1-%Pred-Post: 96 %
FEV1-%Pred-Pre: 97 %
FEV1-Post: 2.52 L
FEV1-Pre: 2.53 L
FEV1FVC-%Change-Post: 4 %
FEV1FVC-%Pred-Pre: 98 %
FEV6-%Change-Post: -3 %
FEV6-%Pred-Post: 95 %
FEV6-%Pred-Pre: 99 %
FEV6-Post: 3.03 L
FEV6-Pre: 3.15 L
FEV6FVC-%Change-Post: 0 %
FEV6FVC-%Pred-Post: 102 %
FEV6FVC-%Pred-Pre: 102 %
FVC-%Change-Post: -4 %
FVC-%Pred-Post: 93 %
FVC-%Pred-Pre: 98 %
FVC-Post: 3.03 L
FVC-Pre: 3.19 L
Post FEV1/FVC ratio: 83 %
Post FEV6/FVC ratio: 100 %
Pre FEV1/FVC ratio: 79 %
Pre FEV6/FVC Ratio: 100 %
RV % pred: 110 %
RV: 1.74 L
TLC % pred: 109 %
TLC: 5.06 L

## 2024-06-10 MED ORDER — ALBUTEROL SULFATE (2.5 MG/3ML) 0.083% IN NEBU
2.5000 mg | INHALATION_SOLUTION | Freq: Once | RESPIRATORY_TRACT | Status: AC
Start: 2024-06-10 — End: 2024-06-10
  Administered 2024-06-10: 2.5 mg via RESPIRATORY_TRACT

## 2024-06-10 NOTE — Telephone Encounter (Signed)
 Spoke to patient by phone, she will be calling today for refill of Opsumit  and should be confirming delivery of Uptravi as well. Will continue to follow up.

## 2024-06-12 ENCOUNTER — Other Ambulatory Visit: Payer: Self-pay | Admitting: Nurse Practitioner

## 2024-06-12 DIAGNOSIS — D376 Neoplasm of uncertain behavior of liver, gallbladder and bile ducts: Secondary | ICD-10-CM

## 2024-06-12 DIAGNOSIS — K7402 Hepatic fibrosis, advanced fibrosis: Secondary | ICD-10-CM

## 2024-06-15 ENCOUNTER — Other Ambulatory Visit (HOSPITAL_COMMUNITY): Payer: Self-pay

## 2024-06-17 ENCOUNTER — Other Ambulatory Visit (HOSPITAL_COMMUNITY): Payer: Self-pay

## 2024-06-19 NOTE — Telephone Encounter (Signed)
 Attempted to contact patient to confirm both specialty medications were able to be ordered. No answer, left voicemail

## 2024-06-24 ENCOUNTER — Other Ambulatory Visit: Payer: Self-pay

## 2024-06-24 NOTE — Telephone Encounter (Signed)
 Med dispense history has updated to show medication filled 06/04/2024.

## 2024-06-28 ENCOUNTER — Telehealth: Admitting: Physician Assistant

## 2024-06-28 DIAGNOSIS — R112 Nausea with vomiting, unspecified: Secondary | ICD-10-CM | POA: Diagnosis not present

## 2024-06-28 NOTE — Progress Notes (Signed)
 Virtual Visit Consent   Nancy [name], you are scheduled for a virtual visit with a [facility] provider today. Just as with appointments in the office, your consent must be obtained to participate. Your consent will be active for this visit and any virtual visit you may have with one of our providers in the next 365 days. If you have a MyChart account, a copy of this consent can be sent to you electronically.  As this is a virtual visit, video technology does not allow for your provider to perform a traditional examination. This may limit your provider's ability to fully assess your condition. If your provider identifies any concerns that need to be evaluated in person or the need to arrange testing (such as labs, EKG, etc.), we will make arrangements to do so. Although advances in technology are sophisticated, we cannot ensure that it will always work on either your end or our end. If the connection with a video visit is poor, the visit may have to be switched to a telephone visit. With either a video or telephone visit, we are not always able to ensure that we have a secure connection.  By engaging in this virtual visit, you consent to the provision of healthcare and authorize for your insurance to be billed (if applicable) for the services provided during this visit. Depending on your insurance coverage, you may receive a charge related to this service.  I need to obtain your verbal consent now. Are you willing to proceed with your visit today? Nancy [name] has provided verbal consent on 06/28/2024 for a virtual visit (video or telephone). Teena Shuck, [place]  Date: 06/28/2024 3:35 PM   Virtual Visit via Video Note   I, Teena Shuck, connected with  Nancy [name]  ([id number], February 15, 1976) on 06/28/24 at  3:30 PM EST by a video-enabled telemedicine application and verified that I am speaking with the correct person using two identifiers.  Location: Patient: Virtual Visit Location  Patient: Home Provider: Virtual Visit Location Provider: Home Office   I discussed the limitations of evaluation and management by telemedicine and the availability of in person appointments. The patient expressed understanding and agreed to proceed.    History of Present Illness: Nancy [name] is a 48 y.o. who identifies as a female who was assigned female at birth, and is being seen today for work note. States she is taking a new medication which causes her to be nauseated and have cramps. She states she is appearing today for a worknote.   HPI: HPI  Problems:  Patient Active Problem List   Diagnosis Date Noted   Chronic combined systolic and diastolic heart failure (HCC) 04/03/2024   Acute right-sided heart failure (HCC) 03/24/2024   Congestive heart failure with unknown left ventricular ejection fraction (HCC) 03/23/2024   Near syncope 03/23/2024   Essential hypertension 03/23/2024   Liver mass 03/23/2024   Tobacco use 03/23/2024   Acute CHF (congestive heart failure) (HCC) 02/07/2024    Allergies: No Known Allergies Medications:  Current Outpatient Medications:    digoxin  (LANOXIN ) 0.125 MG tablet, Take 1 tablet (0.125 mg total) by mouth daily., Disp: 30 tablet, Rfl: 6   EPINEPHrine  0.3 mg/0.3 mL IJ SOAJ injection, Inject 0.3 mg into the muscle as needed for anaphylaxis., Disp: 1 each, Rfl: 0   furosemide  (LASIX ) 20 MG tablet, Take 2 tablets (40 mg total) by mouth daily., Disp: 60 tablet, Rfl: 11   macitentan  (OPSUMIT ) 10 MG tablet, Take 1 tablet (  10 mg total) by mouth daily., Disp: 30 tablet, Rfl: 11   potassium chloride  (KLOR-CON  M) 10 MEQ tablet, Take 1 tablet (10 mEq total) by mouth daily as needed (take when taking furosemide )., Disp: 30 tablet, Rfl: 6   tadalafil , PAH, (ADCIRCA ) 20 MG tablet, Take 2 tablets (40 mg total) by mouth daily., Disp: 60 tablet, Rfl: 6   Varenicline Tartrate, Starter, (CHANTIX STARTING MONTH PAK) 0.5 MG X 11 & 1 MG X 42 TBPK, DAY 1-3, .5 MG  DAILY, DAY 4-7 .5 mg Twice daily day 8 onwards 1 mg Twice daily, Disp: 53 each, Rfl: 1  Observations/Objective: Patient is well-developed, well-nourished in no acute distress.  Resting comfortably  at home.  Head is normocephalic, atraumatic.  No labored breathing.  Speech is clear and coherent with logical content.  Patient is alert and oriented at baseline.    Assessment and Plan: 1. Nausea and vomiting, unspecified vomiting type (Primary)  Advised to discontinue medication if it continues. Also advised to follow up with prescribing provider. Work note provided. Patient was not toxic or ill appearing.   Follow Up Instructions: I discussed the assessment and treatment plan with the patient. The patient was provided an opportunity to ask questions and all were answered. The patient agreed with the plan and demonstrated an understanding of the instructions.  A copy of instructions were sent to the patient via MyChart unless otherwise noted below.    The patient was advised to call back or seek an in-person evaluation if the symptoms worsen or if the condition fails to improve as anticipated.    Teena Shuck, PA-C

## 2024-06-28 NOTE — Patient Instructions (Signed)
  Nancy [name], thank you for joining Teena Shuck, PA-C for today's virtual visit.  While this provider is not your primary care provider (PCP), if your PCP is located in our provider database this encounter information will be shared with them immediately following your visit.   A [facility] MyChart account gives you access to today's visit and all your visits, tests, and labs performed at [facility]  click here if you don't have a [facility] MyChart account or go to [email or web address]  Consent: (Patient) Nancy [name] provided verbal consent for this virtual visit at the beginning of the encounter.  Current Medications:  Current Outpatient Medications:    digoxin  (LANOXIN ) 0.125 MG tablet, Take 1 tablet (0.125 mg total) by mouth daily., Disp: 30 tablet, Rfl: 6   EPINEPHrine  0.3 mg/0.3 mL IJ SOAJ injection, Inject 0.3 mg into the muscle as needed for anaphylaxis., Disp: 1 each, Rfl: 0   furosemide  (LASIX ) 20 MG tablet, Take 2 tablets (40 mg total) by mouth daily., Disp: 60 tablet, Rfl: 11   macitentan  (OPSUMIT ) 10 MG tablet, Take 1 tablet (10 mg total) by mouth daily., Disp: 30 tablet, Rfl: 11   potassium chloride  (KLOR-CON  M) 10 MEQ tablet, Take 1 tablet (10 mEq total) by mouth daily as needed (take when taking furosemide )., Disp: 30 tablet, Rfl: 6   tadalafil , PAH, (ADCIRCA ) 20 MG tablet, Take 2 tablets (40 mg total) by mouth daily., Disp: 60 tablet, Rfl: 6   Varenicline Tartrate, Starter, (CHANTIX STARTING MONTH PAK) 0.5 MG X 11 & 1 MG X 42 TBPK, DAY 1-3, .5 MG DAILY, DAY 4-7 .5 mg Twice daily day 8 onwards 1 mg Twice daily, Disp: 53 each, Rfl: 1   Medications ordered in this encounter:  No orders of the defined types were placed in this encounter.    *If you need refills on other medications prior to your next appointment, please contact your pharmacy*  Follow-Up: Call back or seek an in-person evaluation if the symptoms worsen or if the condition fails  to improve as anticipated.  [facility] [phone]  Other Instructions Follow up with primary provider in 24-48 hours. Report to nearest ER with any worsening symptoms.    If you have been instructed to have an in-person evaluation today at a local Urgent Care facility, please use the link below. It will take you to a list of all of our available [facility], including address, phone number and hours of operation. Please do not delay care.  [facility]  If you or a family member do not have a primary care provider, use the link below to schedule a visit and establish care. When you choose a [facility] primary care physician or advanced practice provider, you gain a long-term partner in health. Find a Primary Care Provider  Learn more about [facility] in-office and virtual care options: [facility] - Get Care Now

## 2024-06-29 ENCOUNTER — Telehealth (HOSPITAL_COMMUNITY): Payer: Self-pay

## 2024-06-29 ENCOUNTER — Other Ambulatory Visit: Payer: Self-pay

## 2024-06-29 NOTE — Telephone Encounter (Signed)
 Advanced Heart Failure Patient Advocate Encounter  Received call from nurse assigned to patient, patient received and started taking medication incorrectly beginning Monday 06/22/2024. Patient was increasing dosing daily instead of weekly and experiencing side effects.   Nurse visited on Friday 06/26/2024 and corrected patient regarding dosing, asked patient to resume starting dose through the weekend.  Patient contacted nurse today Monday 06/29/2024 to say she has discontinued medication (presumed last dose Saturday 11/08 or Sunday 11/09) due to severe leg pain, nausea, vomiting, etc. Patient had to call off work Sunday due to side effects and does not wish to continue this medication.   Forwarding to pharmacist to see if medication should be removed from med list, or if we would like to reach out to the patient to discuss further / attempt to restart medication.  Rachel DEL, CPhT Rx Patient Advocate Phone: [phone]

## 2024-06-30 ENCOUNTER — Telehealth (HOSPITAL_COMMUNITY): Payer: Self-pay

## 2024-06-30 ENCOUNTER — Other Ambulatory Visit (HOSPITAL_COMMUNITY): Payer: Self-pay

## 2024-07-02 NOTE — Telephone Encounter (Signed)
 [employer] prior shara [phone]) needs to be reviewed before submission

## 2024-07-03 ENCOUNTER — Telehealth (HOSPITAL_COMMUNITY): Payer: Self-pay | Admitting: Pharmacist

## 2024-07-03 ENCOUNTER — Other Ambulatory Visit: Payer: Self-pay

## 2024-07-03 MED ORDER — VYNDAMAX 61 MG PO CAPS: 1.0000 | ORAL_CAPSULE | Freq: Every day | ORAL | 11 refills | Status: DC

## 2024-07-03 NOTE — Telephone Encounter (Addendum)
 Message entered in error.

## 2024-07-06 ENCOUNTER — Other Ambulatory Visit (HOSPITAL_COMMUNITY): Payer: Self-pay

## 2024-07-06 NOTE — Telephone Encounter (Signed)
 Prior authorization sent 07/03/24 (Key: [id number]), awaiting response.

## 2024-07-06 NOTE — Addendum Note (Signed)
 Addended by: DELSIE JAUN RAMAN on: 07/06/2024 01:27 PM   Modules accepted: Orders

## 2024-07-06 NOTE — Telephone Encounter (Signed)
 [employer] is requesting additional clinical information before making a determination. Information provided to [facility] by phone on 07/06/2024

## 2024-07-06 NOTE — Telephone Encounter (Signed)
 Prescription sent in error. Now discontinued.

## 2024-07-08 ENCOUNTER — Other Ambulatory Visit (HOSPITAL_COMMUNITY): Payer: Self-pay

## 2024-07-08 ENCOUNTER — Other Ambulatory Visit: Payer: Self-pay

## 2024-07-09 ENCOUNTER — Telehealth (HOSPITAL_COMMUNITY): Payer: Self-pay | Admitting: Cardiology

## 2024-07-10 ENCOUNTER — Other Ambulatory Visit: Payer: Self-pay

## 2024-07-12 ENCOUNTER — Other Ambulatory Visit

## 2024-07-13 ENCOUNTER — Other Ambulatory Visit (HOSPITAL_COMMUNITY): Payer: Self-pay

## 2024-07-13 NOTE — Telephone Encounter (Signed)
 Attempted to contact [employer] for status update, appeal was submitted within last 7 days.  [employer] call center is 'closed for departmental event' Will continue to follow up

## 2024-07-15 ENCOUNTER — Other Ambulatory Visit (HOSPITAL_COMMUNITY): Payer: Self-pay

## 2024-07-15 NOTE — Telephone Encounter (Signed)
 Spoke to representative for [employer], rep stated that appeal may take up to 30 days but this medication may be able to be approved with appropriate documentation. RHC and medication list faxed to [facility] on 07/15/2024

## 2024-07-20 ENCOUNTER — Other Ambulatory Visit (HOSPITAL_COMMUNITY): Payer: Self-pay

## 2024-07-20 ENCOUNTER — Other Ambulatory Visit: Payer: Self-pay

## 2024-07-20 NOTE — Telephone Encounter (Signed)
 Prior authorization for Nancy [name] has been approved Test billing returns $0 copay for 21 day supply. Effective 07/03/2024 to 07/03/2025

## 2024-07-21 ENCOUNTER — Other Ambulatory Visit (HOSPITAL_COMMUNITY): Payer: Self-pay

## 2024-07-21 NOTE — Telephone Encounter (Signed)
 Advanced Heart Failure Patient Advocate Encounter  Enrollment and Prescription forms faxed for Winrevair to [employer] on 07/21/2024.  Rachel DEL, CPhT Rx Patient Advocate Phone: [phone]

## 2024-07-22 ENCOUNTER — Other Ambulatory Visit: Payer: Self-pay

## 2024-07-22 NOTE — Telephone Encounter (Signed)
 Prescription has been triaged to [facility].

## 2024-07-23 ENCOUNTER — Other Ambulatory Visit (HOSPITAL_COMMUNITY): Payer: Self-pay | Admitting: Cardiology

## 2024-07-23 MED ORDER — WINREVAIR 2 X 45 MG ~~LOC~~ KIT: PACK | SUBCUTANEOUS | 3 refills | Status: DC

## 2024-07-24 NOTE — Telephone Encounter (Signed)
 Prescription sent manually to [facility], as this is patients preferred [facility] pharmacy. Patient is aware.

## 2024-07-27 ENCOUNTER — Telehealth (HOSPITAL_COMMUNITY): Payer: Self-pay | Admitting: Cardiology

## 2024-07-27 NOTE — Telephone Encounter (Signed)
 Called to confirm/remind patient of their appointment at the [facility] on 07/27/24 .   Appointment:   [] Confirmed  [x] Left mess   [] No answer/No voice mail  [] VM Full/unable to leave message  [] Phone not in service  Patient reminded to bring all medications and/or complete list.  Confirmed patient has transportation. Gave directions, instructed to utilize valet parking.

## 2024-07-28 ENCOUNTER — Ambulatory Visit (HOSPITAL_COMMUNITY)
Admission: RE | Admit: 2024-07-28 | Discharge: 2024-07-28 | Disposition: A | Source: Ambulatory Visit | Attending: Cardiology | Admitting: Cardiology

## 2024-07-28 ENCOUNTER — Encounter (HOSPITAL_COMMUNITY): Payer: Self-pay | Admitting: Cardiology

## 2024-07-28 VITALS — BP 108/70 | HR 76 | Wt 123.4 lb

## 2024-07-28 DIAGNOSIS — I272 Pulmonary hypertension, unspecified: Secondary | ICD-10-CM

## 2024-07-28 DIAGNOSIS — M25519 Pain in unspecified shoulder: Secondary | ICD-10-CM | POA: Insufficient documentation

## 2024-07-28 DIAGNOSIS — I5042 Chronic combined systolic (congestive) and diastolic (congestive) heart failure: Secondary | ICD-10-CM

## 2024-07-28 LAB — BASIC METABOLIC PANEL WITH GFR
Anion gap: 10 (ref 5–15)
BUN: 11 mg/dL (ref 6–20)
CO2: 29 mmol/L (ref 22–32)
Calcium: 9.4 mg/dL (ref 8.9–10.3)
Chloride: 101 mmol/L (ref 98–111)
Creatinine, Ser: 0.9 mg/dL (ref 0.44–1.00)
GFR, Estimated: >60 mL/min (ref >60)
Glucose, Bld: 95 mg/dL (ref 70–99)
Potassium: 3.5 mmol/L (ref 3.5–5.1)
Sodium: 140 mmol/L (ref 135–145)

## 2024-07-28 LAB — BRAIN NATRIURETIC PEPTIDE: B Natriuretic Peptide: 30 pg/mL (ref 0.0–100.0)

## 2024-07-28 MED ORDER — UPTRAVI 400 MCG PO TABS: 400.0000 ug | ORAL_TABLET | Freq: Two times a day (BID) | ORAL | 3 refills | Status: AC

## 2024-07-28 NOTE — Progress Notes (Signed)
 ADVANCED HF CLINIC CONSULT NOTE  Primary Care: Melba Lamarr BRAVO, NP HF Cardiologist: Dr. Rolan  Chief complaint: Pulmonary hypertension  HPI: 47 y.o. female with history of tobacco, cocaine and methamphetamine abuse.     Seen in ED 6/25 for lower extremity edema. UDS + Cocaine + Amphetamines. BNP 6000. She was told she had heart failure and a mass on her liver. She declined admit. Back in ED 7/25 with atypical chest pain. CTA chest negative for PE. She had noticed progressive shortness of breath and presyncope on a daily basis. Discharged with cardiology follow up.    On 03/20/24 she had liver biopsy. Then 03/23/24 underwent treadmill stress test and but had to stop due to chest pain and presyncope. She was sent to ED and was admitted. Echo showed EF 60-65%, severe RV enlargement and severe RV dysfunction, severe RAE, LV compressed by RV, small pericardial effusion. CTA chest and V/Q scan negative for PE/parenchymal disease. LHC without significant CAD. RHC showed normal filling pressures with mod to severe pulmonary arterial hypertension and low cardiac output. Discharged on tadalafil  and midodrine .    She returns today for followup of pulmonary hypertension/RV failure. She is breathing better on Opsumit  + tadalafil .  She was started on Uptravi , but misunderstood titration directions so increased the dose every day.  She developed leg cramps and abdominal discomfort with rapid (daily) titration and stopped completely.  She is working full time as a leisure centre manager.  Breathing overall better with PH meds.  No dyspnea walking on flat ground or up stairs.  No chest pain.  No lightheadedness or palpitations  She is still smoking but has cut back to 1/2 ppd.  Weight down 6 lbs.   ECG (personally reviewed): NSR, right axis deviation, inferior and anterior ST-T changes.   Labs (8/25): BNP 773, K 3.8, creatinine 0.99, HIV negative, ANA negative, CCP negative, SCL-70 negative.  Labs (10/25): Anti-centromere  Ab negative.   6 minute walk (10/25): 457 m  PMH: 1. Cocaine abuse 2. Methamphetamine abuse 3. Active smoker.  4. Pulmonary hypertension/RV failure:  - Echo (8/25): EF 60-65%, severe RV enlargement/severely decreased RV function, small pericardial effusion.  - V/Q scan (8/25): No chronic PE - CTA chest (8/25): No acute PE, no ILD, hepatic mass noted left liver lobe.  - LHC/RHC (8/25): No significant CAD; mean RA 6, PA 65/40 mean 49, mean PCWP 2, PVR 18.8, CI 1.65, no step-up in oxygen saturation from SVC to PA.  - PFTs (10/25): Normal 5. Liver mass: Left liver lobe.   - Biopsy showed focal bridging fibrosis, sinusoidal obstruction suggestive of hepatic venous outflow obstruction.   FH: No history of pulmonary hypertension or cardiomyopathy.   Current Outpatient Medications  Medication Sig Dispense Refill   digoxin  (LANOXIN ) 0.125 MG tablet Take 1 tablet (0.125 mg total) by mouth daily. 30 tablet 6   EPINEPHrine  0.3 mg/0.3 mL IJ SOAJ injection Inject 0.3 mg into the muscle as needed for anaphylaxis. 1 each 0   furosemide  (LASIX ) 20 MG tablet Take 2 tablets (40 mg total) by mouth daily. 60 tablet 11   macitentan  (OPSUMIT ) 10 MG tablet Take 1 tablet (10 mg total) by mouth daily. 30 tablet 11   [START ON 08/13/2024] Selexipag  (UPTRAVI ) 400 MCG TABS Take 400 mcg by mouth 2 (two) times daily. 60 tablet 3   tadalafil , PAH, (ADCIRCA ) 20 MG tablet Take 2 tablets (40 mg total) by mouth daily. 60 tablet 6   Varenicline  Tartrate, Starter, (CHANTIX  STARTING MONTH PAK)  0.5 MG X 11 & 1 MG X 42 TBPK DAY 1-3, .5 MG DAILY, DAY 4-7 .5 mg Twice daily day 8 onwards 1 mg Twice daily (Patient not taking: Reported on 07/28/2024) 53 each 1   No current facility-administered medications for this encounter.   No Known Allergies   Social History   Socioeconomic History   Marital status: Single    Spouse name: Not on file   Number of children: Not on file   Years of education: Not on file   Highest  education level: Not on file  Occupational History   Not on file  Tobacco Use   Smoking status: Every Day    Current packs/day: 1.00    Average packs/day: 1 pack/day for 20.0 years (20.0 ttl pk-yrs)    Types: Cigarettes   Smokeless tobacco: Never  Vaping Use   Vaping status: Former  Substance and Sexual Activity   Alcohol use: Yes    Comment: every other day   Drug use: Not Currently    Types: Cocaine    Comment: pills   Sexual activity: Yes    Birth control/protection: None  Other Topics Concern   Not on file  Social History Narrative   Not on file   Social Drivers of Health   Financial Resource Strain: Not on file  Food Insecurity: No Food Insecurity (03/23/2024)   Hunger Vital Sign    Worried About Running Out of Food in the Last Year: Never true    Ran Out of Food in the Last Year: Never true  Recent Concern: Food Insecurity - Medium Risk (03/12/2024)   Received from [facility]   Hunger Vital Sign    Within the past 12 months, you worried that your food would run out before you got money to buy more: Never true    Within the past 12 months, the food you bought just didn't last and you didn't have money to get more. : Sometimes true  Transportation Needs: No Transportation Needs (03/23/2024)   PRAPARE - Administrator, Civil Service (Medical): No    Lack of Transportation (Non-Medical): No  Physical Activity: Not on file  Stress: Not on file  Social Connections: Not on file  Intimate Partner Violence: Not At Risk (03/23/2024)   Humiliation, Afraid, Rape, and Kick questionnaire    Fear of Current or Ex-Partner: No    Emotionally Abused: No    Physically Abused: No    Sexually Abused: No    Blood pressure 108/70, pulse 76, weight 56 kg (123 lb 6.4 oz), SpO2 98%.  Filed Weights   07/28/24 1537  Weight: 56 kg (123 lb 6.4 oz)   PHYSICAL EXAM: General: NAD Neck: No JVD, no thyromegaly or thyroid nodule.  Lungs: Clear to auscultation bilaterally with  normal respiratory effort. CV: Nondisplaced PMI.  Heart regular S1/S2, no S3/S4, no murmur.  No peripheral edema.  No carotid bruit.  Normal pedal pulses.  Abdomen: Soft, nontender, no hepatosplenomegaly, no distention.  Skin: Intact without lesions or rashes.  Neurologic: Alert and oriented x 3.  Psych: Normal affect. Extremities: No clubbing or cyanosis.  HEENT: Normal.   ASSESSMENT & PLAN: 1. Pulmonary Hypertension with Cor Pulmonale: Echo 8/25 with EF 60-65%, severe RAE/RVE and severe RV dysfunction, LV compressed by RV, small pericardial effusion. ECG shows RVH. RHC showed severe pulmonary arterial hypertension with CI 1.65, PVR 18.8 WU. CTA chest negative for PE and minimal parenchymal disease (no ILD). V/Q scan with no evidence  for chronic PE.  PFTs normal. HIV negative. Rheumatologic serologies negative. 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 She had been using amphetamines and cocaine regularly (daily over last year), both can be causes of pulmonary hypertension. Has not used since prior to admission.  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  NYHA class I, not volume overloaded.  Stopped selexipag  due to side effects but misunderstood titration directions and was increasing this daily.  - Continue Lasix  40 mg daily, BMET/BNP today.  - Continue digoxin  0.125 mg daily, check level today.  - Continue tadalafil  40 mg daily - Continue Opsumit  10 mg daily - She will try again on selexipag . She will start 200 mcg bid and slowly increase it, will go up to 400 mcg bid after 1 week.  - Will try to get her on sotatercept next.  - Imperative to stay off amphetamines/cocaine. We discussed this again.  - Low cardiac output in setting of severe PAH on 8/25 RHC shows increased risk. We may need to consider IV prostacyclin agonist in future but for continuous IV infusion she is going to have to show that she can stay off  amphetamine/cocaine. 2. Liver Fibrosis: Patient was found incidentally by CT to have liver mass but not cirrhosis, probably not portopulmonary hypertension. S/p liver biopsy showing inflammation and focal bridging fibrosis (stage 3), sinusoidal dilation suggestive of venous outflow obstruction.  - She needs to followup with hepatology => has appt.  3. Smoking: Encouraged her to quit.  - She is cutting back but does not want to use Chantix .   4. Polysubstance Abuse: Has not used cocaine/amphetamines since prior to initial admission.   Follow up in 2 wks with HF pharmacist to help with PH medication titration and see me in 2 months  I spent 31 minutes reviewing records, interviewing/examining patient, and managing orders.   Ezra Shuck, MD 07/28/24

## 2024-07-28 NOTE — Patient Instructions (Addendum)
 RESTART Uptravi  at 200 mcg Twice daily for 2 weeks, then increase to 400 mcg Twice daily  Nason our pharmacist will call you in 2 weeks to help you with your Uptravi .  Labs done today, your results will be available in MyChart, we will contact you for abnormal readings.  Please follow up with our heart failure pharmacist as scheduled.  You have been referred to Orthopedics for your shoulder. They will call you to arrange your appointment.  Your physician recommends that you schedule a follow-up appointment in: 2 months ( February 2026) ** PLEASE CALL THE OFFICE IN [place] TO ARRANGE YOUR FOLLOW UP APPOINTMENT.**  If you have any questions or concerns before your next appointment please send us  a message through [facility] or call our office at [phone].    TO LEAVE A MESSAGE FOR THE NURSE SELECT OPTION 2, PLEASE LEAVE A MESSAGE INCLUDING: YOUR NAME DATE OF BIRTH CALL BACK NUMBER REASON FOR CALL**this is important as we prioritize the call backs  YOU WILL RECEIVE A CALL BACK THE SAME DAY AS LONG AS YOU CALL BEFORE 4:00 PM  At the [facility], you and your health needs are our priority. As part of our continuing mission to provide you with exceptional heart care, we have created designated Provider Care Teams. These Care Teams include your primary Cardiologist (physician) and Advanced Practice Providers (APPs- Physician Assistants and Nurse Practitioners) who all work together to provide you with the care you need, when you need it.   You may see any of the following providers on your designated Care Team at your next follow up: Dr Toribio Fuel Dr Ezra Shuck Dr. Morene Brownie Greig Mosses, NP Caffie Shed, [place] [facility] [place], [place] Beckey Coe, NP Jordan Lee, NP Ellouise Class, NP Tinnie Redman, PharmD Jaun Bash, PharmD   Please be sure to bring in all your medications bottles to every appointment.    Thank you for choosing [place]  [facility]

## 2024-07-29 ENCOUNTER — Encounter (HOSPITAL_COMMUNITY): Admitting: Cardiology

## 2024-07-29 ENCOUNTER — Ambulatory Visit (HOSPITAL_COMMUNITY): Payer: Self-pay | Admitting: Cardiology

## 2024-08-18 ENCOUNTER — Telehealth: Payer: Self-pay | Admitting: Pharmacist

## 2024-08-18 NOTE — Telephone Encounter (Signed)
 Called patient to assess tolerability of Uptravi  after restarting, however, there was no reply. Left instructions to call back.

## 2024-08-22 ENCOUNTER — Other Ambulatory Visit (HOSPITAL_COMMUNITY): Payer: Self-pay

## 2024-08-22 ENCOUNTER — Encounter (HOSPITAL_COMMUNITY): Payer: Self-pay | Admitting: Pharmacist

## 2024-08-28 NOTE — Progress Notes (Incomplete)
 ***In Progress***    Advanced Heart Failure Clinic Note   Primary Care: Melba Lamarr BRAVO, NP HF Cardiologist: Dr. Rolan    HPI:  49 y.o. female with history of tobacco, cocaine and methamphetamine abuse.     Seen in ED 01/2024 for lower extremity edema. UDS + Cocaine + Amphetamines. BNP 6000. She was told she had heart failure and a mass on her liver. She declined admit. Back in ED 02/2024 with atypical chest pain. CTA chest negative for PE. She had noticed progressive shortness of breath and presyncope on a daily basis. Discharged with cardiology follow up.    On 03/20/24 she had liver biopsy. Then 03/23/24 underwent treadmill stress test but had to stop due to chest pain and presyncope. She was sent to ED and was admitted. Echo showed EF 60-65%, severe RV enlargement and severe RV dysfunction, severe RAE, LV compressed by RV, small pericardial effusion. CTA chest and V/Q scan negative for PE/parenchymal disease. LHC without significant CAD. RHC showed normal filling pressures with mod to severe pulmonary arterial hypertension and low cardiac output. Discharged on tadalafil  and midodrine .    Presented to [facility] for followup of pulmonary hypertension/RV failure with Dr. Rolan on 07/28/24. She reported breathing better on Opsumit  + tadalafil .  She was started on Uptravi , but misunderstood titration directions so increased the dose every day.  She developed leg cramps and abdominal discomfort with rapid (daily) titration and stopped completely.  She is working full time as a leisure centre manager.  Breathing was overall better with PH meds.  No dyspnea walking on flat ground or up stairs.  No chest pain.  No lightheadedness or palpitations  She was still smoking but had cut back to 1/2 ppd.  Weight was down 6 lbs.   Today she returns to HF clinic for pharmacist medication titration. At last visit with MD Uptravi  was restarted and process for Winrevair  was initiated. Unfortunately, she did not answer the calls  from the Specialty Pharmacy regarding Winrevair  enrollment so the enrollment process was discontinued. Also has not answered the phone from our pharmacy department for assessment of Uptravi  titration. However, she has been in contact with [facility] based off of email from their nurse. Apparently she had restarted the Uptravi  incorrectly again and was taking 2-200mcg tablets once daily. I provided instructions to specialty pharmacy nurse to restart titration correctly and they were working on contacting the patient.  Today he returns to HF clinic for pharmacist medication titration. At last visit with MD ***.   Overall feeling ***. Dizziness, lightheadedness, fatigue:  Chest pain or palpitations:  How is your breathing?: *** SOB: Able to complete all ADLs. Activity level ***  Weight at home pounds. Takes furosemide /torsemide/bumex *** mg *** daily.  LEE PND/Orthopnea  Appetite *** Low-salt diet:   Physical Exam Cost/affordability of meds  -assess uptravi  titration - doing twice daily and only going up weekly -ask about winrevair  - could resend referral. If restart need to watch cbc closely -will need to call for DM appt - ask kamilah to put on recall list   Has the patient been experiencing any side effects to the medications prescribed?  {YES NO:22349}  Does the patient have any problems obtaining medications due to transportation or finances?   {YES NO:22349}  Understanding of regimen: {excellent/good/fair/poor:19665} Understanding of indications: {excellent/good/fair/poor:19665} Potential of compliance: {excellent/good/fair/poor:19665} Patient understands to avoid NSAIDs. Patient understands to avoid decongestants.    Pertinent Lab Values: Serum creatinine ***, BUN ***, Potassium ***, Sodium ***,  BNP ***, Magnesium ***, Digoxin  ***   Vital Signs: Weight: *** (last clinic weight: ***) Blood pressure: ***  Heart rate: ***   Assessment/Plan: 1. Pulmonary Hypertension  with Cor Pulmonale: Echo 03/2024 with EF 60-65%, severe RAE/RVE and severe RV dysfunction, LV compressed by RV, small pericardial effusion. ECG shows RVH. RHC showed severe pulmonary arterial hypertension with CI 1.65, PVR 18.8 WU. CTA chest negative for PE and minimal parenchymal disease (no ILD). V/Q scan with no evidence for chronic PE.  PFTs normal. HIV negative. Rheumatologic serologies negative. 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 She had been using amphetamines and cocaine regularly (daily over last year), both can be causes of pulmonary hypertension. Has not used since prior to admission.  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   - NYHA class I, not volume overloaded.   - Continue Lasix  40 mg daily - Continue digoxin  0.125 mg daily - Continue tadalafil  40 mg daily - Continue Opsumit  10 mg daily - She will try again on selexipag . She will start 200 mcg bid and slowly increase it, will go up to 400 mcg bid after 1 week.  - Will try to get her on sotatercept next. *** - Imperative to stay off amphetamines/cocaine. We discussed this again.  - Low cardiac output in setting of severe PAH on 03/2024 RHC shows increased risk. May need to consider IV prostacyclin agonist in future but for continuous IV infusion she is going to have to show that she can stay off amphetamine/cocaine. 2. Liver Fibrosis: Patient was found incidentally by CT to have liver mass but not cirrhosis, probably not portopulmonary hypertension. S/p liver biopsy showing inflammation and focal bridging fibrosis (stage 3), sinusoidal dilation suggestive of venous outflow obstruction.  - She needs to followup with hepatology => has appt. *** 3. Smoking: Encouraged her to quit.  - She is cutting back but does not want to use Chantix .   4. Polysubstance Abuse: Has not used cocaine/amphetamines since prior to initial admission.  Follow up ***   Tinnie Redman, PharmD, BCPS, BCCP, CPP Heart Failure Clinic Pharmacist [phone]

## 2024-08-31 ENCOUNTER — Other Ambulatory Visit (HOSPITAL_COMMUNITY): Payer: Self-pay

## 2024-09-01 ENCOUNTER — Other Ambulatory Visit (HOSPITAL_COMMUNITY): Payer: Self-pay

## 2024-09-02 ENCOUNTER — Other Ambulatory Visit (HOSPITAL_COMMUNITY): Payer: Self-pay

## 2024-09-02 ENCOUNTER — Telehealth (HOSPITAL_COMMUNITY): Payer: Self-pay | Admitting: Pharmacist

## 2024-09-02 NOTE — Telephone Encounter (Signed)
 Received the following message from [facility]:  Pharmacist spoke with patient today regarding Uptravi  and she is still taking it incorrectly. Patient is taking 2-200mcg tablets once daily. Pharmacist advised Uptravi  should be taken twice daily. Instructed patient to take Uptravi  200mcg BID while we reach out to MD to provide titration orders and new prescriptions.    Responded to [facility] representative at [facility]. Agree with restarting Uptravi  at 200 mcg BID with plans to uptitrate by 200 mcg BID weekly as tolerated to target dose of 1600 mcg BID. Provided verbal orders for Uptravi  titration and updated prescriptions.  Tinnie Redman, PharmD, BCPS, BCCP, CPP Heart Failure Clinic Pharmacist [phone]

## 2024-09-04 ENCOUNTER — Other Ambulatory Visit (HOSPITAL_COMMUNITY): Payer: Self-pay

## 2024-09-04 MED ORDER — LOSARTAN POTASSIUM 50 MG PO TABS: 50.0000 mg | ORAL_TABLET | Freq: Every day | ORAL | 0 refills | Status: AC

## 2024-09-04 MED ORDER — HYDROCORTISONE (PERIANAL) 2.5 % EX CREA: TOPICAL_CREAM | CUTANEOUS | 0 refills | Status: AC

## 2024-09-04 MED ORDER — MIDODRINE HCL 10 MG PO TABS: 5.0000 mg | ORAL_TABLET | Freq: Three times a day (TID) | ORAL | 11 refills | Status: AC

## 2024-09-04 MED ORDER — SENNOSIDES-DOCUSATE SODIUM 8.6-50 MG PO TABS: 1.0000 | ORAL_TABLET | Freq: Every day | ORAL | 0 refills | Status: AC

## 2024-09-04 MED ORDER — MINERAL OIL RE ENEM: ENEMA | RECTAL | 0 refills | Status: AC

## 2024-09-04 MED ORDER — HYDROCORTISONE (PERIANAL) 2.5 % EX CREA: TOPICAL_CREAM | CUTANEOUS | 1 refills | Status: AC

## 2024-09-07 ENCOUNTER — Other Ambulatory Visit (HOSPITAL_COMMUNITY): Payer: Self-pay

## 2024-09-07 MED ORDER — POTASSIUM CHLORIDE CRYS ER 10 MEQ PO TBCR: 10.0000 meq | EXTENDED_RELEASE_TABLET | Freq: Every day | ORAL | 6 refills | Status: AC | PRN

## 2024-09-07 MED ORDER — TADALAFIL 20 MG PO TABS: 40.0000 mg | ORAL_TABLET | Freq: Every day | ORAL | 6 refills | Status: AC

## 2024-09-07 MED ORDER — FUROSEMIDE 20 MG PO TABS: 60.0000 mg | ORAL_TABLET | Freq: Every day | ORAL | 3 refills | Status: AC

## 2024-09-09 ENCOUNTER — Ambulatory Visit (HOSPITAL_COMMUNITY)

## 2024-09-14 ENCOUNTER — Other Ambulatory Visit (HOSPITAL_COMMUNITY): Payer: Self-pay

## 2024-09-15 ENCOUNTER — Other Ambulatory Visit (HOSPITAL_COMMUNITY): Payer: Self-pay

## 2024-09-15 ENCOUNTER — Other Ambulatory Visit: Payer: Self-pay

## 2024-09-15 ENCOUNTER — Encounter: Payer: Self-pay | Admitting: Pharmacist

## 2024-09-15 ENCOUNTER — Telehealth (HOSPITAL_COMMUNITY): Payer: Self-pay

## 2024-09-16 ENCOUNTER — Other Ambulatory Visit (HOSPITAL_COMMUNITY): Payer: Self-pay

## 2024-09-16 ENCOUNTER — Other Ambulatory Visit: Payer: Self-pay

## 2024-09-17 ENCOUNTER — Other Ambulatory Visit: Payer: Self-pay

## 2024-09-18 ENCOUNTER — Telehealth (HOSPITAL_COMMUNITY): Payer: Self-pay

## 2024-09-18 ENCOUNTER — Other Ambulatory Visit (HOSPITAL_COMMUNITY): Payer: Self-pay

## 2024-09-25 ENCOUNTER — Other Ambulatory Visit (HOSPITAL_COMMUNITY): Payer: Self-pay

## 2024-09-25 NOTE — Telephone Encounter (Signed)
 Advanced Heart Failure Patient Advocate Encounter  Prior authorization for Tadalafil  [facility]) has been submitted and approved. Test billing returns rejection that this medication must be filled by a specialty location; unable to confirm copay at this time.  Key: [id number] Effective: 09/24/2024 to 09/24/2025  Rachel DEL, CPhT Rx Patient Advocate Phone: [phone]

## 2024-09-25 NOTE — Telephone Encounter (Signed)
 Advanced Heart Failure Patient Advocate Encounter  Prior authorization for Uptravi  has been submitted and approved. Test billing returns rejection that this medication must be filled by a specialty location; unable to confirm copay at this time.  Key: [id number] Effective: 09/24/2024 to 09/24/2025  Rachel DEL, CPhT Rx Patient Advocate Phone: [phone]
